# Patient Record
Sex: Male | Born: 1945
Health system: Southern US, Community
[De-identification: ages and names within clinical notes are randomized; demographics above are authoritative.]

## PROBLEM LIST (undated history)

## (undated) DIAGNOSIS — E785 Hyperlipidemia, unspecified: Secondary | ICD-10-CM

## (undated) DIAGNOSIS — I639 Cerebral infarction, unspecified: Secondary | ICD-10-CM

## (undated) DIAGNOSIS — I219 Acute myocardial infarction, unspecified: Secondary | ICD-10-CM

## (undated) DIAGNOSIS — I10 Essential (primary) hypertension: Secondary | ICD-10-CM

## (undated) DIAGNOSIS — C61 Malignant neoplasm of prostate: Secondary | ICD-10-CM

## (undated) DIAGNOSIS — H269 Unspecified cataract: Secondary | ICD-10-CM

## (undated) DIAGNOSIS — I5022 Chronic systolic (congestive) heart failure: Secondary | ICD-10-CM

## (undated) DIAGNOSIS — E119 Type 2 diabetes mellitus without complications: Secondary | ICD-10-CM

## (undated) DIAGNOSIS — Z77098 Contact with and (suspected) exposure to other hazardous, chiefly nonmedicinal, chemicals: Secondary | ICD-10-CM

## (undated) HISTORY — DX: Unspecified cataract: H26.9

## (undated) HISTORY — PX: PROSTATE BIOPSY: SHX241

## (undated) HISTORY — DX: Cerebral infarction, unspecified: I63.9

## (undated) HISTORY — DX: Type 2 diabetes mellitus without complications: E11.9

---

## 2003-01-19 ENCOUNTER — Encounter: Admission: RE | Admit: 2003-01-19 | Discharge: 2003-01-19 | Payer: Self-pay | Admitting: Cardiovascular Disease

## 2003-02-23 ENCOUNTER — Ambulatory Visit (HOSPITAL_BASED_OUTPATIENT_CLINIC_OR_DEPARTMENT_OTHER): Admission: RE | Admit: 2003-02-23 | Discharge: 2003-02-23 | Payer: Self-pay | Admitting: Otolaryngology

## 2003-02-23 ENCOUNTER — Ambulatory Visit (HOSPITAL_COMMUNITY): Admission: RE | Admit: 2003-02-23 | Discharge: 2003-02-23 | Payer: Self-pay | Admitting: Otolaryngology

## 2003-02-23 ENCOUNTER — Encounter (INDEPENDENT_AMBULATORY_CARE_PROVIDER_SITE_OTHER): Payer: Self-pay | Admitting: Specialist

## 2004-01-06 ENCOUNTER — Ambulatory Visit (HOSPITAL_COMMUNITY): Admission: RE | Admit: 2004-01-06 | Discharge: 2004-01-06 | Payer: Self-pay | Admitting: Specialist

## 2004-01-30 ENCOUNTER — Ambulatory Visit (HOSPITAL_COMMUNITY): Admission: RE | Admit: 2004-01-30 | Discharge: 2004-01-30 | Payer: Self-pay | Admitting: Cardiovascular Disease

## 2008-10-02 ENCOUNTER — Ambulatory Visit: Payer: Self-pay | Admitting: Internal Medicine

## 2008-10-02 ENCOUNTER — Encounter (INDEPENDENT_AMBULATORY_CARE_PROVIDER_SITE_OTHER): Payer: Self-pay | Admitting: Neurology

## 2008-10-02 ENCOUNTER — Inpatient Hospital Stay (HOSPITAL_COMMUNITY): Admission: EM | Admit: 2008-10-02 | Discharge: 2008-10-03 | Payer: Self-pay | Admitting: Emergency Medicine

## 2008-10-23 DIAGNOSIS — I2510 Atherosclerotic heart disease of native coronary artery without angina pectoris: Secondary | ICD-10-CM | POA: Insufficient documentation

## 2008-10-23 DIAGNOSIS — E119 Type 2 diabetes mellitus without complications: Secondary | ICD-10-CM

## 2008-10-23 DIAGNOSIS — I10 Essential (primary) hypertension: Secondary | ICD-10-CM | POA: Insufficient documentation

## 2009-05-03 ENCOUNTER — Encounter (INDEPENDENT_AMBULATORY_CARE_PROVIDER_SITE_OTHER): Payer: Self-pay | Admitting: *Deleted

## 2009-05-19 ENCOUNTER — Emergency Department (HOSPITAL_COMMUNITY): Admission: EM | Admit: 2009-05-19 | Discharge: 2009-05-19 | Payer: Self-pay | Admitting: Emergency Medicine

## 2010-04-25 NOTE — Letter (Signed)
Summary: Colonoscopy Date Change Letter  [facility]  [address] [place], [place] [address]   Phone: [phone]  Fax: [phone]      May 03, 2009 MRN: [id number]   Michael Roberts [address] [place], [place]  [address]   Dear [name],   Previously you were recommended to have a repeat colonoscopy around this time. Your chart was recently reviewed by Dr.Malcolm T. Russella Dar of [facility]. Follow up colonoscopy is now recommended in March 2014. This revised recommendation is based on current, nationally recognized guidelines for colorectal cancer screening and polyp surveillance. These guidelines are endorsed by the American Cancer Society, [employer] on Colorectal Cancer as well as numerous other major medical organizations.  Please understand that our recommendation assumes that you do not have any new symptoms such as bleeding, a change in bowel habits, anemia, or significant abdominal discomfort. If you do have any concerning GI symptoms or want to discuss the guideline recommendations, please call to arrange an office visit at your earliest convenience. Otherwise we will keep you in our reminder system and contact you 1-2 months prior to the date listed above to schedule your next colonoscopy.  Thank you,  Judie Petit T. Russella Dar, M.D.  [facility] [phone]

## 2010-06-30 LAB — COMPREHENSIVE METABOLIC PANEL
ALT: 24 U/L (ref 0–53)
AST: 31 U/L (ref 0–37)
Albumin: 3.7 g/dL (ref 3.5–5.2)
Alkaline Phosphatase: 54 U/L (ref 39–117)
BUN: 13 mg/dL (ref 6–23)
CO2: 25 mEq/L (ref 19–32)
Calcium: 9.1 mg/dL (ref 8.4–10.5)
Chloride: 104 mEq/L (ref 96–112)
Creatinine, Ser: 0.99 mg/dL (ref 0.4–1.5)
GFR calc Af Amer: >60 mL/min (ref >60)
GFR calc non Af Amer: >60 mL/min (ref >60)
Glucose, Bld: 155 mg/dL — ABNORMAL HIGH (ref 70–99)
Potassium: 4.6 mEq/L (ref 3.5–5.1)
Sodium: 136 mEq/L (ref 135–145)
Total Bilirubin: 0.8 mg/dL (ref 0.3–1.2)
Total Protein: 6.5 g/dL (ref 6.0–8.3)

## 2010-06-30 LAB — DIFFERENTIAL
Basophils Absolute: 0 10*3/uL (ref 0.0–0.1)
Basophils Relative: 1 % (ref 0–1)
Eosinophils Absolute: 0.1 10*3/uL (ref 0.0–0.7)
Eosinophils Relative: 3 % (ref 0–5)
Lymphocytes Relative: 38 % (ref 12–46)
Lymphs Abs: 1.4 10*3/uL (ref 0.7–4.0)
Monocytes Absolute: 0.3 10*3/uL (ref 0.1–1.0)
Monocytes Relative: 8 % (ref 3–12)
Neutro Abs: 1.9 10*3/uL (ref 1.7–7.7)
Neutrophils Relative %: 51 % (ref 43–77)

## 2010-06-30 LAB — TROPONIN I: Troponin I: 0.02 ng/mL (ref 0.00–0.06)

## 2010-06-30 LAB — URINALYSIS, ROUTINE W REFLEX MICROSCOPIC
Bilirubin Urine: NEGATIVE
Glucose, UA: NEGATIVE mg/dL
Hgb urine dipstick: NEGATIVE
Ketones, ur: NEGATIVE mg/dL
Nitrite: NEGATIVE
Protein, ur: NEGATIVE mg/dL
Specific Gravity, Urine: 1.014 (ref 1.005–1.030)
Urobilinogen, UA: 0.2 mg/dL (ref 0.0–1.0)
pH: 7 (ref 5.0–8.0)

## 2010-06-30 LAB — CK TOTAL AND CKMB (NOT AT ARMC)
CK, MB: 5.2 ng/mL — ABNORMAL HIGH (ref 0.3–4.0)
Relative Index: 2 (ref 0.0–2.5)
Total CK: 264 U/L — ABNORMAL HIGH (ref 7–232)

## 2010-06-30 LAB — PROTIME-INR
INR: 1 (ref 0.00–1.49)
Prothrombin Time: 13 seconds (ref 11.6–15.2)

## 2010-06-30 LAB — CBC
HCT: 44.6 % (ref 39.0–52.0)
Hemoglobin: 15.1 g/dL (ref 13.0–17.0)
MCHC: 33.8 g/dL (ref 30.0–36.0)
MCV: 92.3 fL (ref 78.0–100.0)
Platelets: 178 10*3/uL (ref 150–400)
RBC: 4.84 MIL/uL (ref 4.22–5.81)
RDW: 13 % (ref 11.5–15.5)
WBC: 3.7 10*3/uL — ABNORMAL LOW (ref 4.0–10.5)

## 2010-06-30 LAB — APTT: aPTT: 29 seconds (ref 24–37)

## 2010-06-30 LAB — HEMOGLOBIN A1C
Hgb A1c MFr Bld: 7 % — ABNORMAL HIGH (ref 4.6–6.1)
Mean Plasma Glucose: 154 mg/dL

## 2010-08-06 NOTE — H&P (Signed)
NAME:  Michael Roberts, Michael Roberts               ACCOUNT NO.:  [id number]   MEDICAL RECORD NO.:  [id number]          PATIENT TYPE:  INP   LOCATION:  3106                         FACILITY:  [facility]   PHYSICIAN:  Pramod P. Pearlean Brownie, MD    DATE OF BIRTH:  [birth year]   DATE OF ADMISSION:  10/02/2008  DATE OF DISCHARGE:                              HISTORY & PHYSICAL   REFERRING PHYSICIAN:  Jerelyn Scott, MD   REASON FOR REFERRAL:  Stroke.   HISTORY OF PRESENT ILLNESS:  Mr. Smolen is a 65 year old African  American gentleman who states he went to sleep at 11:00 p.m. last night  and was feeling fine.  He woke up at 12 o'clock to go to the restroom  and was able to walk without problems.  When he woke up again at 2:00  a.m., he noticed that he had left-sided weakness and incoordination and  some slurred speech.  His symptoms have gradually improved since then.  He presented to [facility] Emergency Room beyond the time window for t-  PA.  He feels his slurred speech and weakness and numbness has improved,  though he cannot comment on his balance as he has not walked.  He has no  known prior history of TIA, seizures, strokes, or significant  neurological problems.   Past medical history is significant for hypertension.   HOME MEDICATIONS:  Carvedilol, Lasix, lisinopril, Viagra.   SOCIAL HISTORY:  The patient is married, lives with his wife.  He smokes  cigars occasionally and does not drink alcohol.  He is independent in  activities of daily living.  [facility] the [facility].   REVIEW OF SYSTEMS:  Negative for any recent fever, cough, chest pain,  diarrhea, or other illness.  No active bleeding.   PHYSICAL EXAMINATION:  GENERAL:  A middle-aged African American  gentleman, currently not in distress.  VITAL SIGNS:  He is afebrile with temperature 97.7, blood pressure  166/96, pulse rate 64 per minute, respiratory rate 14 per minute, oxygen  saturation 98% on room  air.  HEAD:  Nontraumatic.  NECK:  Supple.  There is no bruit.  ENT:  Unremarkable.  CARDIAC:  Regular heart sounds.  No murmur or gallop.  LUNGS:  Clear to auscultation.  ABDOMEN:  Soft, nontender.  NEUROLOGIC:  The patient is pleasant, awake, alert, cooperative.  There  is no aphasia, apraxia, dysarthria.  He is oriented to time, place, and  person.  His eye movements are full range.  Visual fields are full to  confrontation testing.  Face is symmetric.  Tongue is midline.  Motor  system exam reveals no upper or lower extremity drift; however, he has  mild finger-to-nose and knee-to-heel dysmetria on the left.  Fine finger  movements that diminish on the left.  He orbits the right over left  upper extremity.  He has good grip strength bilaterally.  Touch and  pinprick sensation are preserved.  His gait was not tested.  Plantars  are downgoing.   On NIH stroke scale, he scored 2.  Modified Rankin scale 0.  DATA REVIEWED:  Noncontrast CAT scan of the head shows a very ill-  defined hypodensity in the right internal capsule and corona radiata  which may represent acute infarct.  Admission labs are pending at this  time.   IMPRESSION:  A 65 year old gentleman with left-sided weakness, numbness,  and incoordination with slurred speech, likely due to the right brain  subcortical infarct.  The patient has presented beyond time window for  thrombolysis but within 12 hours.   PLAN:  I had a long discussion with the patient and his wife regarding  available treatment options.  The patient may meet criteria for  participation in the Point stroke trial.  The patient had expressed some  interest.  We will give him information to review and he will make the  decision on his own.  He will be admitted for further stroke workup.  We  will check MRI scan of the brain, MRA of the brain, carotid transcranial  Doppler studies, 2-D echo, fasting lipid profile, hemoglobin A1c.  He  will have a  swallow eval, and if he passes will be started on aspirin  for now.  He will have physical, occupation, speech therapy consults.           ______________________________  Sunny Schlein. Pearlean Brownie, MD     PPS/MEDQ  D:  10/02/2008  T:  10/03/2008  Job:  [id number]

## 2010-08-06 NOTE — Consult Note (Signed)
NAME:  Michael Roberts, Michael Roberts NO.:  [id number]   MEDICAL RECORD NO.:  [id number]          PATIENT TYPE:  INP   LOCATION:  3038                         FACILITY:  [facility]   PHYSICIAN:  Michael Salvia, MD, FACCDATE OF BIRTH:  [birth year]   DATE OF CONSULTATION:  10/03/2008  DATE OF DISCHARGE:  10/03/2008                                 CONSULTATION   PRIMARY CARE PHYSICIAN:  Michael Headings. Marina Goodell, MD   PRIMARY CARDIOLOGIST:  New, follow up in [place].   CHIEF COMPLAINT:  Left ventricular dysfunction.   HISTORY OF PRESENT ILLNESS:  Michael Roberts is a 65 year old male with no  previous history of coronary artery disease.  He was previously a  patient of Michael Roberts and his EF was noted to be 30%.  Cardiac  catheterization was recommended, but the patient never had the  procedure.   On October 02, 2008, Michael Roberts noted left-sided weakness, incoordination  and some slurred speech.  He came to the emergency room, but with beyond  the window for tPA.  Based on his CT and then MRI, MRA, he was diagnosed  with an acute CVA and admitted.  His symptoms have significantly  improved, but not completely resolved.  He has no prior history of TIA,  seizures, or strokes.  As part of his evaluation, a 2-D echocardiogram  was performed, which showed left ventricular dysfunction with an EF of  25-30%.  Cardiology was asked to evaluate him.   Michael Roberts has never had chest pain.  He also denies shortness of  breath, dyspnea on exertion, orthopnea, PND, edema, or other ischemic  symptoms.  He also denies palpitations.  He admits to a history of  noncompliance with his hypertensive medications, but was states the  medication regimen he was on prior to admission did not have the side  effects he had had in the past and he tolerates these medicines well.  He does not check his blood pressure at home.  He also admits to alcohol  overindulgence and states that he used to drink more heavily than he  does now.   PAST MEDICAL HISTORY:  1. Hypertension.  2. Borderline diabetes.  3. Hyperlipidemia.  4. Obesity with body mass index of 32 in 2009.  5. Status post echocardiogram in March 2009 showing an EF of 30%,      dilated LV with mild LVH and moderate generalized hypokinesia, are      being normal, mild-to-moderate MR and mild TR with minimal AI and      PI.   PAST SURGICAL HISTORY:  He is status post nasal surgery and knee  surgery.   ALLERGIES:  No known drug allergies.   MEDICATIONS PRIOR TO ADMISSION:  1. Coreg 6.25 mg one half tablet b.i.d.  2. Lisinopril 40 mg daily.  3. Lasix 40 mg a day.  4. Viagra 20 mg one half tablet p.r.n.   SOCIAL HISTORY:  He lives in [place] with his wife and is a former  Technical sales engineer, currently working as a Equities trader.  He smokes an  occasional cigar.  He admits  to drinking alcohol on a regular basis and  sometimes overindulging.  He states he used to drink more in the past,  but has cut back.  He denies drug abuse.   FAMILY HISTORY:  His mother died at age 72 of uterine cancer and his  father died at age 23 in a fire.  Neither one with heart disease, but he  does have one sister who died at age 107 of an MI.   REVIEW OF SYSTEMS:  He has weakness and discoordination on his left  side.  He has arthralgias because of the knee surgeries.  He denies  hematemesis, hemoptysis, or melena.  A full 14-point review of systems  is negative other than as stated in the HPI.   PHYSICAL EXAMINATION:  VITAL SIGNS:  Temperature is 98.3, blood pressure  151/83, pulse 64, respiratory rate 20, O2 saturation 98% on room air.  GENERAL:  He is a well-developed [place] American male in no acute  distress.  HEENT:  Normal.  NECK:  There is no lymphadenopathy, thyromegaly, bruit, or JVD noted.  CV:  His heart is regular in rate and rhythm with an S1 and S2, the S2  is pronounced, but no S3 murmur or rub is noted.  Distal pulses are  intact in all four  extremities with no femoral bruits appreciated.  LUNGS:  Essentially clear to auscultation bilaterally.  SKIN:  No rashes or lesions are noted.  ABDOMEN:  Soft and nontender with active bowel sounds and no  splenomegaly is noted.  EXTREMITIES:  There is no cyanosis, clubbing, or edema noted.  MUSCULOSKELETAL:  There is no joint deformity or effusion, although his  right knee has a large midline scar.  There is no spine or CVA  tenderness.  NEUROLOGIC:  He is alert and oriented.  Cranial nerves II through XII  grossly intact and has some left-sided weakness.   CT of the head shows right possibly acute versus subacute infarct in the  right thalamic area, MRI shows an acute infarct and MRA is negative.   EKG is sinus rhythm, rate 69 beats per minute with left bundle-branch  block and no acute ischemic changes.   LABORATORY VALUES:  Hemoglobin 15.1, hematocrit 44.6, WBCs 3.7,  platelets 178.  Sodium 136, potassium 4.6, chloride 104, CO2 25, BUN 13,  creatinine 0.99, glucose 155.  Hemoglobin A1c 7.0, CK-MB 264/5.2 with an  index of 2.0 and troponin I of 0.02.   Echocardiogram shows an EF of 25-30% with mild AR/MR and mild concentric  LVH.   Michael Roberts was seen today by Michael Roberts.   IMPRESSION:  1. Proven cerebrovascular accident felt secondary to small vessel      disease.  2. Cardiomyopathy for greater than 1 year with an ejection fraction of      approximately 30% by echo.  3. Chronic systolic congestive heart failure, class 1.  4. History of EtOH overuse, questionably causing cardiomyopathy.  5. Hypertension, poorly controlled in the past.   RECOMMENDATIONS:  1. ACE inhibitor as prior to admission.  2. Beta-blocker as prior to admission with uptitration as blood      pressure and heart rate will tolerate.  3. Anticoagulation per protocol with the patient having been enrolled      in the POINT trial study, per Neurology.  4. Follow up in the office with cardiac  catheterization to be      considered and possibly scheduled as an outpatient.  5. EtOH abstention.  6. Consideration for ICD if his EF does not improve in 3-6 months and      he is abstaining from alcohol and tobacco.  7. Cardiology followup has been arranged with Dr. Olga Millers of      [facility] on October 24, 2008 at 2:30 p.m.  If he wishes to      obtain Cardiology followup at the [facility] that would also be      appropriate.      Theodore Demark, PA-C      Michael Salvia, MD, [facility]  Electronically Signed    RB/MEDQ  D:  10/03/2008  T:  10/04/2008  Job:  [id number]

## 2010-08-06 NOTE — Discharge Summary (Signed)
Michael Roberts, Michael Roberts               ACCOUNT NO.:  [id number]   MEDICAL RECORD NO.:  [id number]          PATIENT TYPE:  INP   LOCATION:  3038                         FACILITY:  [facility]   PHYSICIAN:  Pramod P. Pearlean Brownie, MD    DATE OF BIRTH:  1945-07-17   DATE OF ADMISSION:  10/02/2008  DATE OF DISCHARGE:  10/03/2008                               DISCHARGE SUMMARY   DISCHARGE DIAGNOSES:  1. Right thalamic infarct secondary to small vessel disease, enrolled      in POINT trial.  2. Cigarette smoker.  3. Left ventricular dysfunction with history of low ejection fraction      in March 2009 with workup recommended, but no followup.  4. Hypertension.  5. Borderline diabetes.  6. Tobacco abuse.  7. Nasal surgery.  8. Bilateral knee surgery with tendon repair x3.   DISCHARGE MEDICINES:  1. Coreg 6.25 mg 1/2 b.i.d.  2. Lisinopril 40 mg a day.  3. Furosemide 40 mg a day.  4. Lisinopril and hydrochlorothiazide 10 mg a day.  5. Zocor 20 mg a day.  6. Aspirin 325 mg a day.  7. End-point study drug (Plavix versus placebo).  8. Viagra p.r.n.   STUDIES PERFORMED:  1. CT of the brain done on admission shows low-density right thalamus      extending to the posterior limb internal capsule, subacute versus      acute infarct.  No hemorrhage.  Old-appearing small vessel changes      elsewhere within the basal ganglion hemispheric white matter.  2. MRI of the brain shows acute infarct of right thalamus with      extension towards the posterior limb internal capsule.  3. MRA of the brain is negative.  4. 2-D echocardiogram shows EF of 25-30% with no obvious source of      embolus.  5. Carotid Doppler shows no ICA stenosis.  6. EKG shows sinus rhythm.  7. Transcranial Doppler performed, results pending.   LABORATORY STUDIES:  Hemoglobin A1c 7.0.  Troponin-I 0.02.  CK 264, CK-  MB 5.2.  Urinalysis negative.  Coagulation studies normal.  Chemistry  with glucose 155, otherwise normal.  Liver function  tests normal.  CBC  with white blood cells 3.7, otherwise normal.  Lipid profile ordered,  not performed.   HISTORY OF PRESENT ILLNESS:  Mr. Beckstrand is a 65 year old right-handed  African American male who went to sleep at 11 p.m. the night prior to  admission feeling normal.  He woke up at 12 midnight to go to the  restroom and was able to walk without problems.  When he woke up again  at 2 a.m., he noticed that he had left-sided weakness and incoordination  with slurred speech.  His symptoms have gradually improved.  He  presented to [facility] Room beyond the time window for tPA.  He feels the slurred speech and weakness and numbness has improved,  though he cannot comment on his balance as he is 65 year old right-handed  African American male who went to sleep at 11 p.m. the night prior to  admission feeling normal.  He woke up at 12 midnight to go to the  restroom and was able to walk without problems.  When he woke up again  at 2 a.m., he noticed that he had left-sided weakness and incoordination  with slurred speech.  His symptoms have gradually improved.  He  presented to [facility] Emergency Room beyond the time window for tPA.  He feels the slurred speech and weakness and numbness has improved,  though he cannot comment on his balance as he is not old.  He has had no  history of TIA, seizure, stroke, or significant neurologic problems.  He  does have  a history significant for hypertension.  He was admitted to  the hospital for further stroke evaluation.    He has had no  history of TIA, seizure, stroke, or significant neurologic problems.  He  does have  a history significant for hypertension.  He was admitted to  the hospital for further stroke evaluation.   HOSPITAL COURSE:  MRI was positive for acute infarct in the right  thalamic region that would explain his left-sided numbness and  incoordination.  Due to the mildness of his symptoms, he was enrolled in  POINT trial which is a trial of aspirin and Plavix versus aspirin and  placebo.  He was started on study drug in the hospital and we will  continue it for 3 months.  It was felt that his further stroke was  secondary to small vessel disease and the patient was placed on aspirin  for secondary stroke prevention.  Of note, his 2-D echocardiogram  revealed EF of 25-30%.  After cardiology consult, it was determined that  this ischemia was present in March 2009 when he saw Dr. Algie Coffer.  At  that time, he had EF of 30%.  In March 2009, the patient was recommended  to have workup but did not follow up.  Recommendations from  Cardiologist, he is to do a cardiac workup in the future to evaluate  etiology of dysfunction.  It is not deemed to be done currently as an  inpatient  and he is safe for discharge home.  Of note, [facility] [place] and can follow up with either  Cardiology there or with [place].  He was evaluated by PT and OT and  felt to have some minimal clumsiness and outpatient therapy followup was  recommended.   CONDITION ON DISCHARGE:  The patient is alert and oriented x3.  No  aphasia.  No dysarthria.  Eye movements are full.  Face is symmetric.  Tongue is midline.  He has no drift, no focal weakness.  He has mild  left finger-nose dysmetria and heel-to-shin ataxia.  His gait is okay  per Therapy.   DISCHARGE/PLAN:  1. Discharge home with family.  2. Add aspirin end-point study drug for secondary stroke prevention.   FOLLOWUP:  1. Follow up with Cardiology either via [place], [place] or [place] for LV      dysfunction workup.  2. Follow up Dr. Pearlean Brownie in his office in 2-3 months.  3. Outpatient PT/OT.      Annie Main, N.P.    ______________________________  Sunny Schlein. Pearlean Brownie, MD    SB/MEDQ  D:  10/03/2008  T:  10/04/2008  Job:  [id number]   cc:   [facility]  [facility]

## 2010-08-09 NOTE — Op Note (Signed)
NAMEWEBBER, MICHIELS                           ACCOUNT NO.:  [id number]   MEDICAL RECORD NO.:  [id number]                   PATIENT TYPE:  AMB   LOCATION:  DSC                                  FACILITY:  [facility]   PHYSICIAN:  Hermelinda Medicus, M.D.                DATE OF BIRTH:  1945/04/17   DATE OF PROCEDURE:  02/23/2003  DATE OF DISCHARGE:                                 OPERATIVE REPORT   PREOPERATIVE DIAGNOSIS:  Septal deviation, turbinate hypertrophy with nasal  obstruction with left maxillary sinusitis with left maxillary mucocyst with  dental involvement.   POSTOPERATIVE DIAGNOSIS:  Septal deviation, turbinate hypertrophy with nasal  obstruction with left maxillary sinusitis with left maxillary mucocyst with  dental involvement.   OPERATION:  Functional endoscopic sinus surgery left maxillary sinus osteal  enlargement with antrostomy with removal of mucocyst with culture, aerobic  and anaerobic, septal reconstruction, turbinate reduction.   SURGEON:  Hermelinda Medicus, M.D.   ANESTHESIA:  Local anesthesia with MAC support with 1% Xylocaine with  epinephrine, 8 mL, with topical cocaine 200 mg.   PROCEDURE:  The patient was placed in the supine position.  Under local  anesthesia, was prepped and draped in the appropriate manner using the usual  HCG, the usual head drape was used.  Once the local anesthesia was  instilled, a hemitransfixion incision was made on the right side, carried  around the columella to the left side, back along the quadrilateral  cartilage which was grossly off the premaxillary crest.  We took a strip of  cartilage from the premaxillary area and also an area of the bony premaxilla  was trimmed using the 4 mm chisel.  We then worked more posteriorly where we  took a thin strip of the quadrilateral cartilage posteriorly and then used  open and closed Jansen-Middletons to remove a very deviated septal ethmoid  region and vomer region septal deviation.  Once  this was removed, the  remainder of the septum was brought back to the midline and we could then  see more reasonably within his nose.  The inferior turbinates were  aggressively out fractured and no mucous membrane was removed and the middle  turbinates were found to be in quite good condition.  The closure was begun  after hemostasis was established with Bovie electrocoagulation and there was  one bleeder which we needed to touch and was done without difficulty.  Closure was with 5-0 plain catgut and through and through septal suture  using blanket stitch x 2 to minimize septal swelling.   Attention was turned to the left maxillary sinus where we could not see the  natural ostium as it was buried in mucous membrane.  We used a curved  suction to touch the area we felt it was near.  We were able to find this by  palpation and then using the scope and the side biting forceps, we  increased  this opening to approximately five times its normal size.  We also used the  angled Blakesley to remove the thick mucous membrane around this and to  increase its size, also.  Once we were in the sinus, we could see the  grayish membrane but the problem was in the floor of the sinus.  We used an  antrostomy approach and then the giraffe forceps to remove thick mucoid type  what appeared to be infected mucocyst material and cultured this aerobic and  anaerobic.  Once this was completed, the sinus was suctioned and the cyst  was removed.  We then placed Merocel packs on both sides which will be  removed tomorrow.  The patient tolerated the procedure well and is doing  well postop and will be continued on his antibiotic  as he did have a murmur.  He was given Ancef IV, Decadron 8, will continue  with Ceftin, and will give him Tylenol with codeine.  His follow up further  beyond tomorrow will be in one week, three weeks, six weeks, and then six  months.                                               Hermelinda Medicus, M.D.    JC/MEDQ  D:  02/23/2003  T:  02/23/2003  Job:  [id number]   cc:   Ricki Rodriguez, M.D.  [address]  [place] [address]

## 2010-08-09 NOTE — H&P (Signed)
Michael Roberts, Michael Roberts                           ACCOUNT NO.:  [id number]   MEDICAL RECORD NO.:  [id number]                   PATIENT TYPE:  AMB   LOCATION:  [facility]                                  FACILITY:  [facility]   PHYSICIAN:  Hermelinda Medicus, M.D.                DATE OF BIRTH:  January 15, 1946   DATE OF ADMISSION:  02/23/2003  DATE OF DISCHARGE:                                HISTORY & PHYSICAL   This patient is a 65 year old male who has had a persistent left maxillary  sinusitis problem with nasal obstruction.  He has also had dental problems  and has had this evaluated but has considerable pain in his left cheek.  He  has been treated by Dr. Algie Coffer with antibiotics using primarily amoxicillin  and Augmentin and has had some temporary improvement but continues to have  some facial swelling with tenderness, congestion on that left side.  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  He now enters for functional endoscopic sinus  surgery for a left maxillary ostial enlargement, antrostomy with removal of  this mucopyocyst for culture.  Also for a septal reconstruction, turbinate  reduction.   Past history is that of medications:  1. Furosemide 40 mg daily.  2. Atenolol 50 daily.  3. Clonidine 0.2 mg daily.  4. Diltiazem 180 mg daily.  5. Micardis HCT 80 mg daily.  6. Tequin 40 mg daily as his antibiotic at this time.   He has no allergies to medications.  He has had knee surgery in the past.  He does have a murmur, grade 2/6 systolic, without hypertension.  He has had  one loose tooth, top front.  The remainder of his history is that of a  borderline diabetic system which he can control with his diet.   PHYSICAL EXAMINATION:  GENERAL:  Well-nourished, well-developed male in no  acute distress.  VITAL SIGNS:  Blood pressure of 120/76.  He is 6 feet  2 inches.  He weighs  243.  HEENT:  His ears are clear.  Oral cavity is clear.  His nose is showing a  septal deviation with nasal obstruction.  He is having some drainage from  his sinus that has been purulent but is now clear on the Tequin.  Nasopharynx, oropharynx, larynx are clear.  NECK:  Free of any thyromegaly, cervical adenopathy, or mass.  Salivary  gland is unremarkable.  CHEST:  Clear.  No rales, rhonchi, or wheezes.  CARDIOVASCULAR:  A grade 2/6 holosystolic murmur.  ABDOMEN:  Unremarkable.  EXTREMITIES:  Unremarkable except for his knee surgery.   INITIAL DIAGNOSES:  1. Septal deviation.  2. Left maxillary sinusitis.  3. History of hypertension.  4. History of holosystolic murmur, grade 2/6.  5.  History of knee surgery.                                                Hermelinda Medicus, M.D.    JC/MEDQ  D:  02/23/2003  T:  02/23/2003  Job:  [id number]   cc:   Ricki Rodriguez, M.D.  [address]  [place] [address]

## 2012-03-09 ENCOUNTER — Ambulatory Visit (INDEPENDENT_AMBULATORY_CARE_PROVIDER_SITE_OTHER): Payer: Medicare Other | Admitting: Radiology

## 2012-03-09 DIAGNOSIS — Z23 Encounter for immunization: Secondary | ICD-10-CM

## 2012-05-25 ENCOUNTER — Encounter: Payer: Self-pay | Admitting: Gastroenterology

## 2012-05-26 ENCOUNTER — Encounter: Payer: Self-pay | Admitting: Gastroenterology

## 2013-02-01 ENCOUNTER — Ambulatory Visit (INDEPENDENT_AMBULATORY_CARE_PROVIDER_SITE_OTHER): Payer: Medicare Other

## 2013-02-01 DIAGNOSIS — Z23 Encounter for immunization: Secondary | ICD-10-CM

## 2013-03-24 DIAGNOSIS — I639 Cerebral infarction, unspecified: Secondary | ICD-10-CM

## 2013-03-24 DIAGNOSIS — I219 Acute myocardial infarction, unspecified: Secondary | ICD-10-CM

## 2013-03-24 HISTORY — DX: Cerebral infarction, unspecified: I63.9

## 2013-03-24 HISTORY — DX: Acute myocardial infarction, unspecified: I21.9

## 2013-10-18 ENCOUNTER — Encounter: Payer: Self-pay | Admitting: Gastroenterology

## 2015-01-17 ENCOUNTER — Ambulatory Visit (INDEPENDENT_AMBULATORY_CARE_PROVIDER_SITE_OTHER): Payer: Commercial Managed Care - HMO | Admitting: Physician Assistant

## 2015-01-17 ENCOUNTER — Ambulatory Visit (INDEPENDENT_AMBULATORY_CARE_PROVIDER_SITE_OTHER): Payer: Commercial Managed Care - HMO

## 2015-01-17 VITALS — BP 150/90 | HR 84 | Temp 98.0°F | Resp 20 | Ht 73.0 in | Wt 235.8 lb

## 2015-01-17 DIAGNOSIS — M129 Arthropathy, unspecified: Secondary | ICD-10-CM

## 2015-01-17 DIAGNOSIS — Z23 Encounter for immunization: Secondary | ICD-10-CM

## 2015-01-17 DIAGNOSIS — M25511 Pain in right shoulder: Secondary | ICD-10-CM | POA: Diagnosis not present

## 2015-01-17 DIAGNOSIS — M19011 Primary osteoarthritis, right shoulder: Secondary | ICD-10-CM

## 2015-01-17 MED ORDER — DICLOFENAC SODIUM 1 % TD GEL: 2.0000 g | Freq: Four times a day (QID) | TRANSDERMAL | Status: DC

## 2015-01-17 MED ORDER — TRIAMCINOLONE ACETONIDE 40 MG/ML IJ SUSP: 40.0000 mg | Freq: Once | INTRAMUSCULAR | Status: DC

## 2015-01-17 NOTE — Progress Notes (Signed)
   Subjective:    Patient ID: Michael Roberts, male    DOB: 1946/01/09, 69 y.o.   MRN: [id number]  HPI Patient presents for right shoulder pain that has been present for the past 4 days and has gotten worse. Started as a dull ache, but is now constant and nagging. Pain does not radiate. Denies trauma, but adds he has arthritis in both knees that requires cortisone injections and pain feels similar to when knees weren't treated. Taking bioflex for knees and has not helped shoulder pain. Says that has had numbness in right hand a couple of times, but not sure if associated with anything. Denies weakness, tingling, loss sensation/ROM/fxn. NKDA.   Review of Systems As noted above.    Objective:   Physical Exam  Constitutional: He is oriented to person, place, and time. He appears well-developed and well-nourished. No distress.  Blood pressure 150/90, pulse 84, temperature 98 F (36.7 C), temperature source Oral, resp. rate 20, height 6\' 1"  (1.854 m), weight 235 lb 12.8 oz (106.958 kg), SpO2 98 %.  HENT:  Head: Normocephalic and atraumatic.  Right Ear: External ear normal.  Left Ear: External ear normal.  Eyes: Conjunctivae and EOM are normal. Pupils are equal, round, and reactive to light. Right eye exhibits no discharge. Left eye exhibits no discharge. No scleral icterus.  Neck: Normal range of motion. Neck supple.  Pulmonary/Chest: Effort normal.  Musculoskeletal: Normal range of motion. He exhibits no edema or tenderness.       Right shoulder: He exhibits crepitus and pain. He exhibits normal range of motion, no tenderness, no bony tenderness, no swelling, no effusion, no deformity, no laceration, no spasm, normal pulse and normal strength.       Left shoulder: Normal.       Cervical back: Normal.       Thoracic back: Normal.       Lumbar back: Normal.       Right upper arm: Normal.       Right hand: Normal.  Lymphadenopathy:    He has no cervical adenopathy.  Neurological: He is  alert and oriented to person, place, and time. He has normal strength and normal reflexes. He displays no atrophy. No cranial nerve deficit or sensory deficit. He exhibits normal muscle tone. Coordination normal.  Skin: Skin is warm and dry. No rash noted. He is not diaphoretic. No erythema.  Psychiatric: He has a normal mood and affect. His behavior is normal. Judgment and thought content normal.   [facility] reading (PRIMARY) by  Dr. Brigitte Pulse. Mild degenerative changes. No acute bony abnormalities.     Assessment & Plan:  1. Shoulder pain, acute, right 2. Arthritis of shoulder region, right Shoulder exercises given. - DG Shoulder Right; Future - triamcinolone acetonide (KENALOG-40) injection 40 mg; Inject 1 mL (40 mg total) into the muscle once. - diclofenac sodium 1% gel 2g topically QID daily if no improvement from injection.  3. Need for prophylactic vaccination and inoculation against influenza - Flu Vaccine QUAD 36+ mos IM   Coralie Stanke PA-C  [facility] 01/17/2015 4:14 PM

## 2015-01-17 NOTE — Patient Instructions (Signed)
Generic Shoulder Exercises  EXERCISES   RANGE OF MOTION (ROM) AND STRETCHING EXERCISES  These exercises may help you when beginning to rehabilitate your injury. Your symptoms may resolve with or without further involvement from your physician, physical therapist or athletic trainer. While completing these exercises, remember:   · Restoring tissue flexibility helps normal motion to return to the joints. This allows healthier, less painful movement and activity.  · An effective stretch should be held for at least 30 seconds.  · A stretch should never be painful. You should only feel a gentle lengthening or release in the stretched tissue.  ROM - Pendulum  · Bend at the waist so that your right / left arm falls away from your body. Support yourself with your opposite hand on a solid surface, such as a table or a countertop.  · Your right / left arm should be perpendicular to the ground. If it is not perpendicular, you need to lean over farther. Relax the muscles in your right / left arm and shoulder as much as possible.  · Gently sway your hips and trunk so they move your right / left arm without any use of your right / left shoulder muscles.  · Progress your movements so that your right / left arm moves side to side, then forward and backward, and finally, both clockwise and counterclockwise.  · Complete __________ repetitions in each direction. Many people use this exercise to relieve discomfort in their shoulder as well as to gain range of motion.  Repeat __________ times. Complete this exercise __________ times per day.  STRETCH - Flexion, Standing  · Stand with good posture. With an underhand grip on your right / left hand and an overhand grip on the opposite hand, grasp a broomstick or cane so that your hands are a little more than shoulder-width apart.  · Keeping your right / left elbow straight and shoulder muscles relaxed, push the stick with your opposite hand to raise your right / left arm in front of your  body and then overhead. Raise your arm until you feel a stretch in your right / left shoulder, but before you have increased shoulder pain.  · Try to avoid shrugging your right / left shoulder as your arm rises by keeping your shoulder blade tucked down and toward your mid-back spine. Hold __________ seconds.  · Slowly return to the starting position.  Repeat __________ times. Complete this exercise __________ times per day.  STRETCH - Internal Rotation  · Place your right / left hand behind your back, palm-up.  · Throw a towel or belt over your opposite shoulder. Grasp the towel/belt with your right / left hand.  · While keeping an upright posture, gently pull up on the towel/belt until you feel a stretch in the front of your right / left shoulder.  · Avoid shrugging your right / left shoulder as your arm rises by keeping your shoulder blade tucked down and toward your mid-back spine.  · Hold __________. Release the stretch by lowering your opposite hand.  Repeat __________ times. Complete this exercise __________ times per day.  STRETCH - External Rotation and Abduction  · Stagger your stance through a doorframe. It does not matter which foot is forward.  · As instructed by your physician, physical therapist or athletic trainer, place your hands:    And forearms above your head and on the door frame.    And forearms at head-height and on the door frame.      At elbow-height and on the door frame.  · Keeping your head and chest upright and your stomach muscles tight to prevent over-extending your low-back, slowly shift your weight onto your front foot until you feel a stretch across your chest and/or in the front of your shoulders.  · Hold __________ seconds. Shift your weight to your back foot to release the stretch.  Repeat __________ times. Complete this stretch __________ times per day.   STRENGTHENING EXERCISES   These exercises may help you when beginning to rehabilitate your injury. They may resolve your  symptoms with or without further involvement from your physician, physical therapist or athletic trainer. While completing these exercises, remember:   · Muscles can gain both the endurance and the strength needed for everyday activities through controlled exercises.  · Complete these exercises as instructed by your physician, physical therapist or athletic trainer. Progress the resistance and repetitions only as guided.  · You may experience muscle soreness or fatigue, but the pain or discomfort you are trying to eliminate should never worsen during these exercises. If this pain does worsen, stop and make certain you are following the directions exactly. If the pain is still present after adjustments, discontinue the exercise until you can discuss the trouble with your clinician.  · If advised by your physician, during your recovery, avoid activity or exercises which involve actions that place your right / left hand or elbow above your head or behind your back or head. These positions stress the tissues which are trying to heal.  STRENGTH - Scapular Depression and Adduction  · With good posture, sit on a firm chair. Supported your arms in front of you with pillows, arm rests or a table top. Have your elbows in line with the sides of your body.  · Gently draw your shoulder blades down and toward your mid-back spine. Gradually increase the tension without tensing the muscles along the top of your shoulders and the back of your neck.  · Hold for __________ seconds. Slowly release the tension and relax your muscles completely before completing the next repetition.  · After you have practiced this exercise, remove the arm support and complete it in standing as well as sitting.  Repeat __________ times. Complete this exercise __________ times per day.   STRENGTH - External Rotators  · Secure a rubber exercise band/tubing to a fixed object so that it is at the same height as your right / left elbow when you are standing  or sitting on a firm surface.  · Stand or sit so that the secured exercise band/tubing is at your side that is not injured.  · Bend your elbow 90 degrees. Place a folded towel or small pillow under your right / left arm so that your elbow is a few inches away from your side.  · Keeping the tension on the exercise band/tubing, pull it away from your body, as if pivoting on your elbow. Be sure to keep your body steady so that the movement is only coming from your shoulder rotating.  · Hold __________ seconds. Release the tension in a controlled manner as you return to the starting position.  Repeat __________ times. Complete this exercise __________ times per day.   STRENGTH - Supraspinatus  · Stand or sit with good posture. Grasp a __________ weight or an exercise band/tubing so that your hand is "thumbs-up," like when you shake hands.  · Slowly lift your right / left hand from your thigh into the air,   traveling about 30 degrees from straight out at your side. Lift your hand to shoulder height or as far as you can without increasing any shoulder pain. Initially, many people do not lift their hands above shoulder height.  · Avoid shrugging your right / left shoulder as your arm rises by keeping your shoulder blade tucked down and toward your mid-back spine.  · Hold for __________ seconds. Control the descent of your hand as you slowly return to your starting position.  Repeat __________ times. Complete this exercise __________ times per day.   STRENGTH - Shoulder Extensors  · Secure a rubber exercise band/tubing so that it is at the height of your shoulders when you are either standing or sitting on a firm arm-less chair.  · With a thumbs-up grip, grasp an end of the band/tubing in each hand. Straighten your elbows and lift your hands straight in front of you at shoulder height. Step back away from the secured end of band/tubing until it becomes tense.  · Squeezing your shoulder blades together, pull your hands down  to the sides of your thighs. Do not allow your hands to go behind you.  · Hold for __________ seconds. Slowly ease the tension on the band/tubing as you reverse the directions and return to the starting position.  Repeat __________ times. Complete this exercise __________ times per day.   STRENGTH - Scapular Retractors  · Secure a rubber exercise band/tubing so that it is at the height of your shoulders when you are either standing or sitting on a firm arm-less chair.  · With a palm-down grip, grasp an end of the band/tubing in each hand. Straighten your elbows and lift your hands straight in front of you at shoulder height. Step back away from the secured end of band/tubing until it becomes tense.  · Squeezing your shoulder blades together, draw your elbows back as you bend them. Keep your upper arm lifted away from your body throughout the exercise.  · Hold __________ seconds. Slowly ease the tension on the band/tubing as you reverse the directions and return to the starting position.  Repeat __________ times. Complete this exercise __________ times per day.  STRENGTH - Scapular Depressors  · Find a sturdy chair without wheels, such as a from a dining room table.  · Keeping your feet on the floor, lift your bottom from the seat and lock your elbows.  · Keeping your elbows straight, allow gravity to pull your body weight down. Your shoulders will rise toward your ears.  · Raise your body against gravity by drawing your shoulder blades down your back, shortening the distance between your shoulders and ears. Although your feet should always maintain contact with the floor, your feet should progressively support less body weight as you get stronger.  · Hold __________ seconds. In a controlled and slow manner, lower your body weight to begin the next repetition.  Repeat __________ times. Complete this exercise __________ times per day.      This information is not intended to replace advice given to you by your health  care provider. Make sure you discuss any questions you have with your health care provider.     Document Released: 01/22/2005 Document Revised: 03/31/2014 Document Reviewed: 06/22/2008  [employer]vier Interactive Patient Education ©2016 [employer]

## 2015-01-22 ENCOUNTER — Ambulatory Visit: Payer: Self-pay | Admitting: Family Medicine

## 2015-06-19 ENCOUNTER — Inpatient Hospital Stay (HOSPITAL_COMMUNITY): Payer: Medicare Other

## 2015-06-19 ENCOUNTER — Encounter (HOSPITAL_COMMUNITY): Payer: Self-pay | Admitting: *Deleted

## 2015-06-19 ENCOUNTER — Inpatient Hospital Stay (HOSPITAL_COMMUNITY)
Admission: EM | Admit: 2015-06-19 | Discharge: 2015-06-25 | DRG: 286 | Disposition: A | Discharge status: Home / Self Care | Payer: Medicare Other | Attending: Internal Medicine | Admitting: Internal Medicine

## 2015-06-19 ENCOUNTER — Emergency Department (HOSPITAL_COMMUNITY): Payer: Medicare Other

## 2015-06-19 DIAGNOSIS — E876 Hypokalemia: Secondary | ICD-10-CM | POA: Diagnosis not present

## 2015-06-19 DIAGNOSIS — I5022 Chronic systolic (congestive) heart failure: Secondary | ICD-10-CM | POA: Diagnosis present

## 2015-06-19 DIAGNOSIS — Z8249 Family history of ischemic heart disease and other diseases of the circulatory system: Secondary | ICD-10-CM

## 2015-06-19 DIAGNOSIS — J9602 Acute respiratory failure with hypercapnia: Secondary | ICD-10-CM | POA: Diagnosis present

## 2015-06-19 DIAGNOSIS — I480 Paroxysmal atrial fibrillation: Secondary | ICD-10-CM | POA: Diagnosis not present

## 2015-06-19 DIAGNOSIS — E874 Mixed disorder of acid-base balance: Secondary | ICD-10-CM | POA: Diagnosis not present

## 2015-06-19 DIAGNOSIS — Z87891 Personal history of nicotine dependence: Secondary | ICD-10-CM | POA: Diagnosis not present

## 2015-06-19 DIAGNOSIS — G9340 Encephalopathy, unspecified: Secondary | ICD-10-CM | POA: Diagnosis not present

## 2015-06-19 DIAGNOSIS — Z79899 Other long term (current) drug therapy: Secondary | ICD-10-CM | POA: Diagnosis not present

## 2015-06-19 DIAGNOSIS — I4891 Unspecified atrial fibrillation: Secondary | ICD-10-CM | POA: Insufficient documentation

## 2015-06-19 DIAGNOSIS — T45515A Adverse effect of anticoagulants, initial encounter: Secondary | ICD-10-CM | POA: Diagnosis not present

## 2015-06-19 DIAGNOSIS — J9621 Acute and chronic respiratory failure with hypoxia: Secondary | ICD-10-CM | POA: Diagnosis not present

## 2015-06-19 DIAGNOSIS — R0600 Dyspnea, unspecified: Secondary | ICD-10-CM | POA: Diagnosis not present

## 2015-06-19 DIAGNOSIS — R7989 Other specified abnormal findings of blood chemistry: Secondary | ICD-10-CM | POA: Diagnosis not present

## 2015-06-19 DIAGNOSIS — J9601 Acute respiratory failure with hypoxia: Secondary | ICD-10-CM | POA: Diagnosis present

## 2015-06-19 DIAGNOSIS — I429 Cardiomyopathy, unspecified: Secondary | ICD-10-CM | POA: Diagnosis not present

## 2015-06-19 DIAGNOSIS — J9600 Acute respiratory failure, unspecified whether with hypoxia or hypercapnia: Secondary | ICD-10-CM | POA: Diagnosis not present

## 2015-06-19 DIAGNOSIS — Z8673 Personal history of transient ischemic attack (TIA), and cerebral infarction without residual deficits: Secondary | ICD-10-CM

## 2015-06-19 DIAGNOSIS — I248 Other forms of acute ischemic heart disease: Secondary | ICD-10-CM | POA: Diagnosis present

## 2015-06-19 DIAGNOSIS — R319 Hematuria, unspecified: Secondary | ICD-10-CM | POA: Diagnosis not present

## 2015-06-19 DIAGNOSIS — N179 Acute kidney failure, unspecified: Secondary | ICD-10-CM | POA: Diagnosis not present

## 2015-06-19 DIAGNOSIS — I428 Other cardiomyopathies: Secondary | ICD-10-CM | POA: Diagnosis present

## 2015-06-19 DIAGNOSIS — J9690 Respiratory failure, unspecified, unspecified whether with hypoxia or hypercapnia: Secondary | ICD-10-CM | POA: Diagnosis not present

## 2015-06-19 DIAGNOSIS — I252 Old myocardial infarction: Secondary | ICD-10-CM

## 2015-06-19 DIAGNOSIS — J80 Acute respiratory distress syndrome: Secondary | ICD-10-CM | POA: Diagnosis not present

## 2015-06-19 DIAGNOSIS — Z7984 Long term (current) use of oral hypoglycemic drugs: Secondary | ICD-10-CM

## 2015-06-19 DIAGNOSIS — E785 Hyperlipidemia, unspecified: Secondary | ICD-10-CM | POA: Diagnosis present

## 2015-06-19 DIAGNOSIS — E119 Type 2 diabetes mellitus without complications: Secondary | ICD-10-CM | POA: Diagnosis not present

## 2015-06-19 DIAGNOSIS — Z833 Family history of diabetes mellitus: Secondary | ICD-10-CM

## 2015-06-19 DIAGNOSIS — I5023 Acute on chronic systolic (congestive) heart failure: Secondary | ICD-10-CM | POA: Diagnosis present

## 2015-06-19 DIAGNOSIS — R0602 Shortness of breath: Secondary | ICD-10-CM | POA: Diagnosis not present

## 2015-06-19 DIAGNOSIS — J9691 Respiratory failure, unspecified with hypoxia: Secondary | ICD-10-CM | POA: Diagnosis present

## 2015-06-19 DIAGNOSIS — E872 Acidosis, unspecified: Secondary | ICD-10-CM

## 2015-06-19 DIAGNOSIS — Z23 Encounter for immunization: Secondary | ICD-10-CM | POA: Diagnosis not present

## 2015-06-19 DIAGNOSIS — J810 Acute pulmonary edema: Secondary | ICD-10-CM

## 2015-06-19 DIAGNOSIS — R7309 Other abnormal glucose: Secondary | ICD-10-CM

## 2015-06-19 DIAGNOSIS — J811 Chronic pulmonary edema: Secondary | ICD-10-CM | POA: Diagnosis not present

## 2015-06-19 DIAGNOSIS — R069 Unspecified abnormalities of breathing: Secondary | ICD-10-CM | POA: Diagnosis not present

## 2015-06-19 DIAGNOSIS — I110 Hypertensive heart disease with heart failure: Secondary | ICD-10-CM | POA: Diagnosis not present

## 2015-06-19 DIAGNOSIS — R0603 Acute respiratory distress: Secondary | ICD-10-CM

## 2015-06-19 DIAGNOSIS — F1010 Alcohol abuse, uncomplicated: Secondary | ICD-10-CM | POA: Insufficient documentation

## 2015-06-19 DIAGNOSIS — E782 Mixed hyperlipidemia: Secondary | ICD-10-CM | POA: Diagnosis not present

## 2015-06-19 HISTORY — DX: Hyperlipidemia, unspecified: E78.5

## 2015-06-19 HISTORY — DX: Chronic systolic (congestive) heart failure: I50.22

## 2015-06-19 HISTORY — DX: Essential (primary) hypertension: I10

## 2015-06-19 HISTORY — DX: Acute myocardial infarction, unspecified: I21.9

## 2015-06-19 LAB — TROPONIN I
TROPONIN I: 0.12 ng/mL — AB (ref <0.031)
Troponin I: 0.03 ng/mL (ref <0.031)
Troponin I: 0.09 ng/mL — ABNORMAL HIGH (ref <0.031)

## 2015-06-19 LAB — ETHANOL

## 2015-06-19 LAB — BRAIN NATRIURETIC PEPTIDE
B Natriuretic Peptide: 506.6 pg/mL — ABNORMAL HIGH (ref 0.0–100.0)
B Natriuretic Peptide: 452.8 pg/mL — ABNORMAL HIGH (ref 0.0–100.0)

## 2015-06-19 LAB — I-STAT ARTERIAL BLOOD GAS, ED
ACID-BASE DEFICIT: 6 mmol/L — AB (ref 0.0–2.0)
Acid-base deficit: 11 mmol/L — ABNORMAL HIGH (ref 0.0–2.0)
BICARBONATE: 23.4 meq/L (ref 20.0–24.0)
Bicarbonate: 20.1 mEq/L (ref 20.0–24.0)
O2 SAT: 100 %
O2 Saturation: 94 %
PCO2 ART: 66.7 mmHg — AB (ref 35.0–45.0)
PH ART: 7.087 — AB (ref 7.350–7.450)
PO2 ART: 90 mmHg (ref 80.0–100.0)
TCO2: 22 mmol/L (ref 0–100)
TCO2: 25 mmol/L (ref 0–100)
pCO2 arterial: 60.4 mmHg (ref 35.0–45.0)
pH, Arterial: 7.197 — CL (ref 7.350–7.450)
pO2, Arterial: 277 mmHg — ABNORMAL HIGH (ref 80.0–100.0)

## 2015-06-19 LAB — BASIC METABOLIC PANEL
Anion gap: 16 — ABNORMAL HIGH (ref 5–15)
BUN: 15 mg/dL (ref 6–20)
CO2: 20 mmol/L — AB (ref 22–32)
Calcium: 8.9 mg/dL (ref 8.9–10.3)
Chloride: 105 mmol/L (ref 101–111)
Creatinine, Ser: 1.44 mg/dL — ABNORMAL HIGH (ref 0.61–1.24)
GFR calc Af Amer: 56 mL/min — ABNORMAL LOW (ref >60)
GFR, EST NON AFRICAN AMERICAN: 48 mL/min — AB (ref >60)
GLUCOSE: 125 mg/dL — AB (ref 65–99)
POTASSIUM: 4.5 mmol/L (ref 3.5–5.1)
Sodium: 141 mmol/L (ref 135–145)

## 2015-06-19 LAB — COMPREHENSIVE METABOLIC PANEL
ALBUMIN: 3.8 g/dL (ref 3.5–5.0)
ALK PHOS: 75 U/L (ref 38–126)
ALT: 43 U/L (ref 17–63)
ANION GAP: 19 — AB (ref 5–15)
AST: 137 U/L — ABNORMAL HIGH (ref 15–41)
BUN: 15 mg/dL (ref 6–20)
CALCIUM: 8.9 mg/dL (ref 8.9–10.3)
CHLORIDE: 104 mmol/L (ref 101–111)
CO2: 14 mmol/L — AB (ref 22–32)
CREATININE: 1.49 mg/dL — AB (ref 0.61–1.24)
GFR calc Af Amer: 53 mL/min — ABNORMAL LOW (ref >60)
GFR calc non Af Amer: 46 mL/min — ABNORMAL LOW (ref >60)
GLUCOSE: 344 mg/dL — AB (ref 65–99)
Potassium: 5 mmol/L (ref 3.5–5.1)
SODIUM: 137 mmol/L (ref 135–145)
Total Bilirubin: 1.2 mg/dL (ref 0.3–1.2)
Total Protein: 6.6 g/dL (ref 6.5–8.1)

## 2015-06-19 LAB — PROTIME-INR
INR: 1.09 (ref 0.00–1.49)
PROTHROMBIN TIME: 14.3 s (ref 11.6–15.2)

## 2015-06-19 LAB — CBC WITH DIFFERENTIAL/PLATELET
BASOS ABS: 0 10*3/uL (ref 0.0–0.1)
Basophils Relative: 0 %
Eosinophils Absolute: 0.1 10*3/uL (ref 0.0–0.7)
Eosinophils Relative: 1 %
HCT: 51 % (ref 39.0–52.0)
HEMOGLOBIN: 16.3 g/dL (ref 13.0–17.0)
LYMPHS PCT: 74 %
Lymphs Abs: 7.3 10*3/uL — ABNORMAL HIGH (ref 0.7–4.0)
MCH: 31.5 pg (ref 26.0–34.0)
MCHC: 32 g/dL (ref 30.0–36.0)
MCV: 98.6 fL (ref 78.0–100.0)
MONOS PCT: 5 %
Monocytes Absolute: 0.5 10*3/uL (ref 0.1–1.0)
NEUTROS PCT: 20 %
Neutro Abs: 2 10*3/uL (ref 1.7–7.7)
Platelets: 219 10*3/uL (ref 150–400)
RBC: 5.17 MIL/uL (ref 4.22–5.81)
RDW: 12.8 % (ref 11.5–15.5)
WBC: 9.9 10*3/uL (ref 4.0–10.5)

## 2015-06-19 LAB — PATHOLOGIST SMEAR REVIEW

## 2015-06-19 LAB — BLOOD GAS, ARTERIAL
Acid-base deficit: 0.1 mmol/L (ref 0.0–2.0)
BICARBONATE: 23.3 meq/L (ref 20.0–24.0)
DRAWN BY: 406621
FIO2: 0.5
O2 Saturation: 99 %
PATIENT TEMPERATURE: 99
PCO2 ART: 33.9 mmHg — AB (ref 35.0–45.0)
PEEP: 5 cmH2O
PH ART: 7.452 — AB (ref 7.350–7.450)
PO2 ART: 159 mmHg — AB (ref 80.0–100.0)
RATE: 20 resp/min
TCO2: 24.3 mmol/L (ref 0–100)
VT: 660 mL

## 2015-06-19 LAB — I-STAT TROPONIN, ED: Troponin i, poc: 0.02 ng/mL (ref 0.00–0.08)

## 2015-06-19 LAB — ECHOCARDIOGRAM COMPLETE
Height: 74 in
Weight: 3760 oz

## 2015-06-19 LAB — URINALYSIS, ROUTINE W REFLEX MICROSCOPIC
Bilirubin Urine: NEGATIVE
GLUCOSE, UA: NEGATIVE mg/dL
KETONES UR: NEGATIVE mg/dL
LEUKOCYTES UA: NEGATIVE
NITRITE: NEGATIVE
PH: 6 (ref 5.0–8.0)
Protein, ur: 30 mg/dL — AB
SPECIFIC GRAVITY, URINE: 1.014 (ref 1.005–1.030)

## 2015-06-19 LAB — GLUCOSE, CAPILLARY
GLUCOSE-CAPILLARY: 125 mg/dL — AB (ref 65–99)
GLUCOSE-CAPILLARY: 111 mg/dL — AB (ref 65–99)
Glucose-Capillary: 113 mg/dL — ABNORMAL HIGH (ref 65–99)

## 2015-06-19 LAB — URINE MICROSCOPIC-ADD ON

## 2015-06-19 LAB — PROCALCITONIN: Procalcitonin: <0.1 ng/mL

## 2015-06-19 LAB — HEPARIN LEVEL (UNFRACTIONATED)
HEPARIN UNFRACTIONATED: 0.52 [IU]/mL (ref 0.30–0.70)
Heparin Unfractionated: 0.7 IU/mL (ref 0.30–0.70)

## 2015-06-19 LAB — LACTIC ACID, PLASMA
LACTIC ACID, VENOUS: 2.9 mmol/L — AB (ref 0.5–2.0)
LACTIC ACID, VENOUS: 2.2 mmol/L — AB (ref 0.5–2.0)

## 2015-06-19 LAB — RAPID URINE DRUG SCREEN, HOSP PERFORMED
Amphetamines: NOT DETECTED
Barbiturates: NOT DETECTED
Benzodiazepines: POSITIVE — AB
Cocaine: NOT DETECTED
OPIATES: NOT DETECTED
TETRAHYDROCANNABINOL: POSITIVE — AB

## 2015-06-19 LAB — PHOSPHORUS: Phosphorus: 6.3 mg/dL — ABNORMAL HIGH (ref 2.5–4.6)

## 2015-06-19 LAB — I-STAT CG4 LACTIC ACID, ED: LACTIC ACID, VENOUS: 10.5 mmol/L — AB (ref 0.5–2.0)

## 2015-06-19 LAB — MRSA PCR SCREENING: MRSA BY PCR: NEGATIVE

## 2015-06-19 LAB — TSH: TSH: 3.974 u[IU]/mL (ref 0.350–4.500)

## 2015-06-19 LAB — MAGNESIUM: Magnesium: 2 mg/dL (ref 1.7–2.4)

## 2015-06-19 LAB — APTT: APTT: 28 s (ref 24–37)

## 2015-06-19 MED ORDER — FUROSEMIDE 10 MG/ML IJ SOLN
80.0000 mg | Freq: Once | INTRAMUSCULAR | Status: AC
  Administered 2015-06-19: 80 mg via INTRAVENOUS
  Filled 2015-06-19: qty 8

## 2015-06-19 MED ORDER — CHLORHEXIDINE GLUCONATE 0.12% ORAL RINSE (MEDLINE KIT)
15.0000 mL | Freq: Two times a day (BID) | OROMUCOSAL | Status: DC
  Administered 2015-06-19 – 2015-06-21 (×5): 15 mL via OROMUCOSAL

## 2015-06-19 MED ORDER — FENTANYL CITRATE (PF) 2500 MCG/50ML IJ SOLN
25.0000 ug/h | INTRAMUSCULAR | Status: DC
  Administered 2015-06-19 – 2015-06-20 (×3): 200 ug/h via INTRAVENOUS
  Filled 2015-06-19 (×3): qty 50

## 2015-06-19 MED ORDER — ASPIRIN 81 MG PO CHEW: 324.0000 mg | CHEWABLE_TABLET | Freq: Once | ORAL | Status: DC

## 2015-06-19 MED ORDER — MIDAZOLAM HCL 2 MG/2ML IJ SOLN: 1.0000 mg | INTRAMUSCULAR | Status: DC | PRN

## 2015-06-19 MED ORDER — SUCCINYLCHOLINE CHLORIDE 20 MG/ML IJ SOLN
INTRAMUSCULAR | Status: DC | PRN
  Administered 2015-06-19: 150 mg via INTRAVENOUS

## 2015-06-19 MED ORDER — FOLIC ACID 5 MG/ML IJ SOLN
1.0000 mg | Freq: Every day | INTRAMUSCULAR | Status: DC
  Administered 2015-06-19 – 2015-06-23 (×5): 1 mg via INTRAVENOUS
  Filled 2015-06-19: qty 0.2
  Filled 2015-06-19: qty 0
  Filled 2015-06-19 (×2): qty 0.2
  Filled 2015-06-19 (×2): qty 0
  Filled 2015-06-19: qty 0.2
  Filled 2015-06-19: qty 0
  Filled 2015-06-19: qty 0.2

## 2015-06-19 MED ORDER — FENTANYL CITRATE (PF) 100 MCG/2ML IJ SOLN
50.0000 ug | Freq: Once | INTRAMUSCULAR | Status: AC
  Administered 2015-06-19: 50 ug via INTRAVENOUS

## 2015-06-19 MED ORDER — HEPARIN BOLUS VIA INFUSION
4000.0000 [IU] | Freq: Once | INTRAVENOUS | Status: AC
  Administered 2015-06-19: 4000 [IU] via INTRAVENOUS
  Filled 2015-06-19: qty 4000

## 2015-06-19 MED ORDER — SODIUM CHLORIDE 0.9 % IV SOLN: 250.0000 mL | INTRAVENOUS | Status: DC | PRN

## 2015-06-19 MED ORDER — FENTANYL CITRATE (PF) 2500 MCG/50ML IJ SOLN
10.0000 ug/h | INTRAMUSCULAR | Status: DC
  Administered 2015-06-19: 25 ug/h via INTRAVENOUS
  Filled 2015-06-19: qty 50

## 2015-06-19 MED ORDER — FUROSEMIDE 10 MG/ML IJ SOLN
40.0000 mg | Freq: Once | INTRAMUSCULAR | Status: AC
  Administered 2015-06-19: 40 mg via INTRAVENOUS
  Filled 2015-06-19: qty 4

## 2015-06-19 MED ORDER — INSULIN ASPART 100 UNIT/ML ~~LOC~~ SOLN
0.0000 [IU] | SUBCUTANEOUS | Status: DC
  Administered 2015-06-19 – 2015-06-21 (×4): 2 [IU] via SUBCUTANEOUS
  Administered 2015-06-21 (×2): 3 [IU] via SUBCUTANEOUS
  Administered 2015-06-21 (×2): 2 [IU] via SUBCUTANEOUS
  Administered 2015-06-21: 3 [IU] via SUBCUTANEOUS
  Administered 2015-06-22 – 2015-06-23 (×2): 2 [IU] via SUBCUTANEOUS

## 2015-06-19 MED ORDER — PANTOPRAZOLE SODIUM 40 MG IV SOLR
40.0000 mg | Freq: Every day | INTRAVENOUS | Status: DC
  Administered 2015-06-19 – 2015-06-22 (×4): 40 mg via INTRAVENOUS
  Filled 2015-06-19 (×4): qty 40

## 2015-06-19 MED ORDER — ASPIRIN 81 MG PO CHEW
324.0000 mg | CHEWABLE_TABLET | Freq: Once | ORAL | Status: AC
  Administered 2015-06-19: 324 mg via ORAL
  Filled 2015-06-19: qty 4

## 2015-06-19 MED ORDER — ETOMIDATE 2 MG/ML IV SOLN
INTRAVENOUS | Status: DC | PRN
  Administered 2015-06-19: 20 mg via INTRAVENOUS

## 2015-06-19 MED ORDER — SODIUM CHLORIDE 0.9 % IV SOLN: INTRAVENOUS | Status: DC

## 2015-06-19 MED ORDER — ANTISEPTIC ORAL RINSE SOLUTION (CORINZ): 7.0000 mL | Freq: Four times a day (QID) | OROMUCOSAL | Status: DC

## 2015-06-19 MED ORDER — FENTANYL CITRATE (PF) 100 MCG/2ML IJ SOLN
200.0000 ug | Freq: Once | INTRAMUSCULAR | Status: AC
  Administered 2015-06-19: 200 ug via INTRAVENOUS

## 2015-06-19 MED ORDER — PANTOPRAZOLE SODIUM 40 MG IV SOLR: 40.0000 mg | Freq: Every day | INTRAVENOUS | Status: DC

## 2015-06-19 MED ORDER — PROPOFOL 1000 MG/100ML IV EMUL
INTRAVENOUS | Status: AC
  Administered 2015-06-19: 15 ug/kg/min via INTRAVENOUS
  Filled 2015-06-19: qty 100

## 2015-06-19 MED ORDER — ANTISEPTIC ORAL RINSE SOLUTION (CORINZ)
7.0000 mL | OROMUCOSAL | Status: DC
  Administered 2015-06-19 – 2015-06-21 (×22): 7 mL via OROMUCOSAL

## 2015-06-19 MED ORDER — THIAMINE HCL 100 MG/ML IJ SOLN
100.0000 mg | Freq: Every day | INTRAMUSCULAR | Status: DC
  Administered 2015-06-19 – 2015-06-23 (×5): 100 mg via INTRAVENOUS
  Filled 2015-06-19 (×5): qty 2

## 2015-06-19 MED ORDER — FENTANYL BOLUS VIA INFUSION
25.0000 ug | INTRAVENOUS | Status: DC | PRN
  Filled 2015-06-19: qty 25

## 2015-06-19 MED ORDER — MIDAZOLAM HCL 2 MG/2ML IJ SOLN
1.0000 mg | INTRAMUSCULAR | Status: DC | PRN
  Administered 2015-06-19: 1 mg via INTRAVENOUS

## 2015-06-19 MED ORDER — SODIUM CHLORIDE 0.9 % IV SOLN
3.0000 g | Freq: Four times a day (QID) | INTRAVENOUS | Status: DC
  Administered 2015-06-19 – 2015-06-21 (×8): 3 g via INTRAVENOUS
  Filled 2015-06-19 (×10): qty 3

## 2015-06-19 MED ORDER — MIDAZOLAM HCL 2 MG/2ML IJ SOLN
INTRAMUSCULAR | Status: AC
  Filled 2015-06-19: qty 2

## 2015-06-19 MED ORDER — PIPERACILLIN-TAZOBACTAM 3.375 G IVPB
3.3750 g | Freq: Three times a day (TID) | INTRAVENOUS | Status: DC
  Administered 2015-06-19: 3.375 g via INTRAVENOUS
  Filled 2015-06-19 (×3): qty 50

## 2015-06-19 MED ORDER — NITROGLYCERIN IN D5W 200-5 MCG/ML-% IV SOLN
0.0000 ug/min | Freq: Once | INTRAVENOUS | Status: AC
  Administered 2015-06-19: 10 ug/min via INTRAVENOUS
  Filled 2015-06-19: qty 250

## 2015-06-19 MED ORDER — INFLUENZA VAC SPLIT QUAD 0.5 ML IM SUSY
0.5000 mL | PREFILLED_SYRINGE | INTRAMUSCULAR | Status: DC
Start: 2015-06-20 — End: 2015-06-21
  Filled 2015-06-19: qty 0.5

## 2015-06-19 MED ORDER — CHLORHEXIDINE GLUCONATE 0.12% ORAL RINSE (MEDLINE KIT)
15.0000 mL | Freq: Two times a day (BID) | OROMUCOSAL | Status: DC
  Administered 2015-06-19: 15 mL via OROMUCOSAL

## 2015-06-19 MED ORDER — FENTANYL CITRATE (PF) 100 MCG/2ML IJ SOLN
INTRAMUSCULAR | Status: AC
  Filled 2015-06-19: qty 4

## 2015-06-19 MED ORDER — HEPARIN (PORCINE) IN NACL 100-0.45 UNIT/ML-% IJ SOLN
1300.0000 [IU]/h | INTRAMUSCULAR | Status: DC
  Administered 2015-06-19: 1300 [IU]/h via INTRAVENOUS
  Administered 2015-06-19: 1400 [IU]/h via INTRAVENOUS
  Filled 2015-06-19 (×3): qty 250

## 2015-06-19 NOTE — Care Management Note (Signed)
Case Management Note  Patient Details  Name: Michael Roberts MRN: [id number] Date of Birth: 1945-07-01  Subjective/Objective:  Adm w resp failure, vent                  Action/Plan: lives w wife, pcp dr guest   Expected Discharge Date:                  Expected Discharge Plan:  [place]  In-House Referral:     Discharge planning Services     Post Acute Care Choice:    Choice offered to:     DME Arranged:    DME Agency:     HH Arranged:    [place] Agency:     Status of Service:     Medicare Important Message Given:    Date Medicare IM Given:    Medicare IM give by:    Date Additional Medicare IM Given:    Additional Medicare Important Message give by:     If discussed at [place] of Stay Meetings, dates discussed:    Additional Comments: ur review done  Lacretia Leigh, RN 06/19/2015, 8:42 AM

## 2015-06-19 NOTE — Progress Notes (Signed)
RT NOTE:  Critical I-STAT ABG reported to Dr. Betsey Holiday. MD planning to intubate patient.

## 2015-06-19 NOTE — ED Provider Notes (Signed)
CSN: [id number]     Arrival date & time 06/19/15  [id number] History  By signing my name below, I, Altamease Oiler, attest that this documentation has been prepared under the direction and in the presence of Orpah Greek, MD. Electronically Signed: Altamease Oiler, ED Scribe. 06/19/2015. 3:57 AM   Chief Complaint  Patient presents with  . Respiratory Distress   The history is provided by the EMS personnel. No language interpreter was used.   Brought in by EMS on a CPAP machine, Michael Roberts is a 70 y.o. male with history of MI, irregular heart rhythm, DM, HTN, and stroke who presents to the Emergency Department complaining of constant SOB with onset this morning. He woke up from sleeping short of breath. On EMS arrival the patient had oxygen saturation of 42% on RA. Associated symptoms include chest pain. He was given 324 mg of Asprin PTA. His wife is at bedside and reports that he is a heavy everyday drinker. No known history of emphysema or COPD. Pt is a former smoker. His medical care is at the [facility].  Past Medical History  Diagnosis Date  . Diabetes mellitus without complication ([place])   . Stroke ([place])   . Myocardial infarction [facility])    History reviewed. No pertinent past surgical history. Family History  Problem Relation Age of Onset  . Diabetes Mother    Social History  Substance Use Topics  . Smoking status: Never Smoker   . Smokeless tobacco: None  . Alcohol Use: 0.0 oz/week    0 Standard drinks or equivalent per week     Comment: "drinks heavy" per wife    Review of Systems  Respiratory: Positive for shortness of breath.   Cardiovascular: Positive for chest pain.  All other systems reviewed and are negative.  Allergies  Review of patient's allergies indicates no known allergies.  Home Medications   Prior to Admission medications   Medication Sig Start Date End Date Taking? Authorizing Provider  carvedilol (COREG) 12.5 MG tablet Take 12.5 mg by mouth 2 (two)  times daily with a meal.    Historical Provider, MD  diclofenac sodium (VOLTAREN) 1 % GEL Apply 2 g topically 4 (four) times daily. 01/17/15   Tishira R Brewington, PA-C  glucose blood test strip 1 each by Other route as needed for other. Use as instructed    Historical Provider, MD  hydrochlorothiazide (HYDRODIURIL) 25 MG tablet Take 25 mg by mouth daily.    Historical Provider, MD  losartan (COZAAR) 100 MG tablet Take 100 mg by mouth daily.    Historical Provider, MD  metFORMIN (GLUCOPHAGE) 500 MG tablet Take by mouth 2 (two) times daily with a meal.    Historical Provider, MD  polyvinyl alcohol (LIQUIFILM TEARS) 1.4 % ophthalmic solution 1 drop as needed for dry eyes.    Historical Provider, MD  povidone-iodine (BETADINE) 0.3 % SOLN Place 1 each vaginally once.    Historical Provider, MD  simvastatin (ZOCOR) 20 MG tablet Take 20 mg by mouth daily.    Historical Provider, MD   BP 184/129 mmHg  Pulse 126  Temp(Src) 97.7 F (36.5 C) (Axillary)  Resp 34  Ht 6\' 1"  (1.854 m)  Wt 235 lb (106.595 kg)  BMI 31.01 kg/m2  SpO2 97% Physical Exam  Constitutional: He is oriented to person, place, and time. He appears well-developed and well-nourished. He appears distressed.  HENT:  Head: Normocephalic and atraumatic.  Right Ear: Hearing normal.  Left Ear: Hearing normal.  Nose:  Nose normal.  Mouth/Throat: Oropharynx is clear and moist and mucous membranes are normal.  Eyes: Conjunctivae and EOM are normal. Pupils are equal, round, and reactive to light.  Neck: Normal range of motion. Neck supple. JVD present.  Cardiovascular: Regular rhythm, S1 normal and S2 normal.  Tachycardia present.  Exam reveals no gallop and no friction rub.   No murmur heard. Pulmonary/Chest: He is in respiratory distress. He has rales. He exhibits no tenderness.  Abdominal: Soft. Normal appearance and bowel sounds are normal. There is no hepatosplenomegaly. There is no tenderness. There is no rebound, no guarding, no  tenderness at McBurney's point and negative Murphy's sign. No hernia.  Musculoskeletal: Normal range of motion. He exhibits edema.  Trace bilateral pitting edema  Neurological: He is alert and oriented to person, place, and time. He has normal strength. No cranial nerve deficit or sensory deficit. Coordination normal. GCS eye subscore is 4. GCS verbal subscore is 5. GCS motor subscore is 6.  Skin: Skin is warm and intact. No rash noted. He is diaphoretic. No cyanosis.  Psychiatric: He has a normal mood and affect. His speech is normal and behavior is normal. Thought content normal.  Nursing note and vitals reviewed.   ED Course  Procedures (including critical care time)   CRITICAL CARE Performed by: Orpah Greek.   Total critical care time: 35 minutes  Critical care time was exclusive of separately billable procedures and treating other patients.  Critical care was necessary to treat or prevent imminent or life-threatening deterioration.  Critical care was time spent personally by me on the following activities: development of treatment plan with patient and/or surrogate as well as nursing, discussions with consultants, evaluation of patient's response to treatment, examination of patient, obtaining history from patient or surrogate, ordering and performing treatments and interventions, ordering and review of laboratory studies, ordering and review of radiographic studies, pulse oximetry and re-evaluation of patient's condition.  COORDINATION OF CARE: 3:37 AM Treatment plan includes lab work, CXR, EKG, Lasix, and heparin.   Labs Review Labs Reviewed  CBC WITH DIFFERENTIAL/PLATELET - Abnormal; Notable for the following:    Lymphs Abs 7.3 (*)    All other components within normal limits  COMPREHENSIVE METABOLIC PANEL - Abnormal; Notable for the following:    CO2 14 (*)    Glucose, Bld 344 (*)    Creatinine, Ser 1.49 (*)    AST 137 (*)    GFR calc non Af Amer 46 (*)     GFR calc Af Amer 53 (*)    Anion gap 19 (*)    All other components within normal limits  BRAIN NATRIURETIC PEPTIDE - Abnormal; Notable for the following:    B Natriuretic Peptide 506.6 (*)    All other components within normal limits  URINALYSIS, ROUTINE W REFLEX MICROSCOPIC (NOT AT [facility]) - Abnormal; Notable for the following:    APPearance CLOUDY (*)    Hgb urine dipstick MODERATE (*)    Protein, ur 30 (*)    All other components within normal limits  URINE MICROSCOPIC-ADD ON - Abnormal; Notable for the following:    Squamous Epithelial / LPF 0-5 (*)    Bacteria, UA RARE (*)    Casts HYALINE CASTS (*)    All other components within normal limits  I-STAT ARTERIAL BLOOD GAS, ED - Abnormal; Notable for the following:    pH, Arterial 7.087 (*)    pCO2 arterial 66.7 (*)    pO2, Arterial 277.0 (*)  Acid-base deficit 11.0 (*)    All other components within normal limits  I-STAT CG4 LACTIC ACID, ED - Abnormal; Notable for the following:    Lactic Acid, Venous 10.50 (*)    All other components within normal limits  TROPONIN I  PROTIME-INR  APTT  HEPARIN LEVEL (UNFRACTIONATED)  LACTIC ACID, PLASMA  LACTIC ACID, PLASMA  TROPONIN I  TROPONIN I  I-STAT TROPOININ, ED  CBG MONITORING, ED    Imaging Review Dg Chest Port 1 View  06/19/2015  CLINICAL DATA:  Acute onset of respiratory distress. Initial encounter. EXAM: PORTABLE CHEST 1 VIEW COMPARISON:  Chest radiograph performed 01/06/2004 FINDINGS: The lungs are well-aerated. Vascular congestion is noted. Bilateral central and bibasilar airspace opacities may reflect pulmonary edema or pneumonia. There is no evidence of pleural effusion or pneumothorax. The cardiomediastinal silhouette is borderline normal in size. No acute osseous abnormalities are seen. IMPRESSION: Vascular congestion noted. Bilateral central and bibasilar airspace opacities may reflect pulmonary edema or pneumonia. Electronically Signed   By: Garald Balding M.D.   On:  06/19/2015 03:55   I have personally reviewed and evaluated these images and lab results as part of my medical decision-making.   EKG Interpretation   Date/Time:  Tuesday June 19 2015 03:31:42 EDT Ventricular Rate:  123 PR Interval:    QRS Duration: 151 QT Interval:  352 QTC Calculation: 503 R Axis:   -60 Text Interpretation:  Atrial fibrillation Left bundle branch block  Confirmed by POLLINA  MD, CHRISTOPHER [phone]) on 06/19/2015 4:46:51 AM      MDM   Final diagnoses:  Acute pulmonary edema (HCC)  Acute respiratory failure with hypoxia and hypercapnia (HCC)  Lactic acidosis  Atrial fibrillation with RVR ([place])    Patient presents to the ER for evaluation of sudden onset of severe shortness of breath. Patient was brought to the emergency department by EMS on CPAP. Patient was found at home gasping for breath with room air oxygen saturation 42%. Oxygenation did improve with CPAP, but patient was still in severe distress at arrival. He was breathing near 50 times a minute initially. He was given a short trial on BiPAP here in the ER, but continued to be in distress. Blood gas showed hypercarbia as well as profound mixed metabolic and respiratory acidosis. At this point it was decided to intubate him. Intubation was performed without difficulty.  CXR performed at arrival showed evidence of edema bilaterally. Cannot rule out pneumonia, but picture does not fit pneumonia. Patient is afebrile, has a normal white blood cell count. He had been doing well prior to going to bed last night. Secretions have been pink and frothy here in the ER. He was initiated on IV Lasix and a nitroglycerin drip at arrival.  Patient found to be in atrial fibrillation with rapid ventricular response at arrival as well. After initiation of treatment and sedation, heart rate came down into the 110 range. The atrial fibrillation was therefore not initially treated. He was started on heparin for his atrial  fibrillation as well as the fact that he was having chest pain and could not rule out acute coronary syndrome or even MI. His initial EKG shows left bundle branch block without crepitus criteria. We do not have an old EKG.  Patient has been difficult to sedate. Patient's wife reports that he is a heavy drinker. He became hypotensive with the tip in conjunction with the IV nitroglycerin. At this point to prevent was discontinued and he was started on a fentanyl  drip. Will add Versed as needed.  I personally performed the services described in this documentation, which was scribed in my presence. The recorded information has been reviewed and is accurate.    Orpah Greek, MD 06/19/15 [phone]

## 2015-06-19 NOTE — Progress Notes (Signed)
Initial Nutrition Assessment  DOCUMENTATION CODES:   Obesity unspecified  INTERVENTION:  -RD continue to monitor  NUTRITION DIAGNOSIS:   Inadequate oral intake related to inability to eat as evidenced by NPO status.  GOAL:   Provide needs based on ASPEN/SCCM guidelines  MONITOR:   Vent status, Labs, I & O's, Skin  REASON FOR ASSESSMENT:   Ventilator    ASSESSMENT:   MARC MIARS is a 71 y.o. male with PMH as outlined below. He was brought to [facility] ED via EMS early AM hours 03/28 with SOB that awoke him from sleep.  Patient is currently intubated on ventilator support MV: 13.7 L/min Temp (24hrs), Avg:98.6 F (37 C), Min:97.7 F (36.5 C), Max:99 F (37.2 C)  Propofol: none ml/hr  Pt intubated, sedated at bedside. No family present. Per chart review, pt arrived with SpO2 42% on RA. Placed on CPAP but became hypoxic, w/ resp acidosis. CXR revealed RLL PNA. Was intubated. Pt has hx of ETOH abuse.  Nutrition-Focused physical exam completed. Findings are no fat depletion, no muscle depletion, and no edema.   Labs: CBGs 113-125, Phos 6.3 Medications: Versed PRN, Fentanyl drip  Diet Order:  Diet NPO time specified  Skin:  Reviewed, no issues  Last BM:  PTA  Height:   Ht Readings from Last 1 Encounters:  06/19/15 6\' 2"  (1.88 m)    Weight:   Wt Readings from Last 1 Encounters:  06/19/15 235 lb (106.595 kg)    Ideal Body Weight:  86.36 kg  BMI:  Body mass index is 30.16 kg/(m^2).  Estimated Nutritional Needs:   Kcal:  [id number]  Protein:  130-160 gm  Fluid:  >/= 1.2L  EDUCATION NEEDS:   No education needs identified at this time  Satira Anis. Pratham Cassatt, MS, RD LDN After Hours/Weekend Pager [phone]

## 2015-06-19 NOTE — Progress Notes (Signed)
ANTICOAGULATION CONSULT NOTE - Initial Consult  Pharmacy Consult for heparin Indication: chest pain/ACS  No Known Allergies  Patient Measurements: Height: 6\' 1"  (185.4 cm) Weight: 235 lb 14.3 oz (107 kg) IBW/kg (Calculated) : 79.9 Heparin Dosing Weight: 105kg   Medical History: Past Medical History  Diagnosis Date  . Diabetes mellitus without complication ([place])   . Stroke [facility])     Assessment: 69yo c/o SOB and CP w/ significant cardiac PMH to begin heparin.  Goal of Therapy:  Heparin level 0.3-0.7 units/ml Monitor platelets by anticoagulation protocol: Yes   Plan:  Will give heparin 4000 units IV bolus x1 followed by gtt at 1400 units/hr and monitor heparin levels and CBC.  Wynona Neat, PharmD, BCPS  06/19/2015,3:47 AM

## 2015-06-19 NOTE — Progress Notes (Signed)
Pharmacy Antibiotic Note  Michael Roberts is a 70 y.o. male admitted on 06/19/2015 with aspiration.  Pharmacy has been consulted for Zosyn dosing.  Plan: Zosyn 3.375g IV q8h (4 hour infusion).  Height: 6\' 2"  (188 cm) (Verbal by Patient) Weight: 235 lb (106.595 kg) IBW/kg (Calculated) : 82.2  Temp (24hrs), Avg:98.4 F (36.9 C), Min:97.7 F (36.5 C), Max:98.8 F (37.1 C)   Recent Labs Lab 06/19/15 0345 06/19/15 0403  WBC 9.9  --   CREATININE 1.49*  --   LATICACIDVEN  --  10.50*    Estimated Creatinine Clearance: 60.9 mL/min (by C-G formula based on Cr of 1.49).    No Known Allergies    Thank you for allowing pharmacy to be a part of this patient's care.  Wynona Neat, PharmD, BCPS  06/19/2015 5:28 AM

## 2015-06-19 NOTE — Progress Notes (Signed)
RT NOTE:   Pt will be transported to 2H03 by Dub Mikes, RT. Report was given to [facility], RT.

## 2015-06-19 NOTE — Progress Notes (Signed)
Echocardiogram 2D Echocardiogram has been performed.  Michael Roberts 06/19/2015, 3:20 PM

## 2015-06-19 NOTE — Progress Notes (Signed)
Inpatient Diabetes Program Recommendations  AACE/ADA: New Consensus Statement on Inpatient Glycemic Control (2015)  Target Ranges:  Prepandial:   less than 140 mg/dL      Peak postprandial:   less than 180 mg/dL (1-2 hours)      Critically ill patients:  140 - 180 mg/dL   Results for Michael Roberts, Michael Roberts (MRN [id number]) as of 06/19/2015 07:50  Ref. Range 06/19/2015 03:45  Glucose Latest Ref Range: 65-99 mg/dL 344 (H)    Admit with: SOB/ Respiratory Failure  History: DM2, ETOH Abuse, MI  Home DM Meds: Metformin 500 mg bid  Current Insulin Orders: Novolog Moderate Correction Scale/ SSI (0-15 units) Q4 hours       -Note Novolog Moderate Correction Scale/ SSI (0-15 units) Q4 hours to start this AM.  -Currently Intubated.       MD- Please consider starting ICU Glycemic Control Protocol if you anticipate patient will be Intubated for several days     --Will follow patient during hospitalization--  Wyn Quaker RN, MSN, CDE Diabetes Coordinator Inpatient Glycemic Control Team Team Pager: [phone] (8a-5p)

## 2015-06-19 NOTE — ED Notes (Signed)
Patient presents from home via EMS.  Patient was awakened from sleep due to SOB.  Arrived on CPAP  CBG 180, BP 162/110 P110-120

## 2015-06-19 NOTE — H&P (Signed)
PULMONARY / CRITICAL CARE MEDICINE   Name: Michael Roberts MRN: [id number] DOB: 27-Feb-1946    ADMISSION DATE:  06/19/2015 CONSULTATION DATE:  06/19/15  REFERRING MD:  EDP  CHIEF COMPLAINT:  SOB  HISTORY OF PRESENT ILLNESS:  Pt is encephelopathic; therefore, this HPI is obtained from chart review. Michael Roberts is a 70 y.o. male with PMH as outlined below.  He was brought to [facility] ED via EMS early AM hours 03/28 with SOB that awoke him from sleep.  He had apparently been fine the night prior, and work up from sleep suddenly during early AM hours with SOB.  On EMS arrival, pt had SpO2 42% on RA and pt had also complained of chest pain. He was placed on CPAP and brought to ED for further evaluation.  In ED, he had persistent hypoxia and ABG revealed respiratory acidosis.  He was subsequently intubated and PCCM was called for admission.  CXR revealed vascular congestion and possible RLL PNA (? Aspiration).  Per ED RN, pt's wife stated that pt is a heavy everyday ETOH drinker.  PAST MEDICAL HISTORY :  He  has a past medical history of Diabetes mellitus without complication ([place]); Stroke [facility]); and Myocardial infarction ([place]).  PAST SURGICAL HISTORY: He  has no past surgical history on file.  No Known Allergies  Current Facility-Administered Medications on File Prior to Encounter  Medication  . triamcinolone acetonide (KENALOG-40) injection 40 mg   Current Outpatient Prescriptions on File Prior to Encounter  Medication Sig  . carvedilol (COREG) 12.5 MG tablet Take 12.5 mg by mouth 2 (two) times daily with a meal.  . diclofenac sodium (VOLTAREN) 1 % GEL Apply 2 g topically 4 (four) times daily.  Marland Kitchen glucose blood test strip 1 each by Other route as needed for other. Use as instructed  . hydrochlorothiazide (HYDRODIURIL) 25 MG tablet Take 25 mg by mouth daily.  Marland Kitchen losartan (COZAAR) 100 MG tablet Take 100 mg by mouth daily.  . metFORMIN (GLUCOPHAGE) 500 MG tablet Take by mouth 2 (two) times  daily with a meal.  . polyvinyl alcohol (LIQUIFILM TEARS) 1.4 % ophthalmic solution 1 drop as needed for dry eyes.  . povidone-iodine (BETADINE) 0.3 % SOLN Place 1 each vaginally once.  . simvastatin (ZOCOR) 20 MG tablet Take 20 mg by mouth daily.    FAMILY HISTORY:  His indicated that his mother is alive.   SOCIAL HISTORY: He  reports that he has never smoked. He does not have any smokeless tobacco history on file. He reports that he drinks alcohol. He reports that he does not use illicit drugs.  REVIEW OF SYSTEMS:   Unable to obtain as pt is encephalopathic.  SUBJECTIVE:  On vent, agitated.  VITAL SIGNS: BP 102/70 mmHg  Pulse 42  Temp(Src) 98.8 F (37.1 C) (Axillary)  Resp 16  Ht 6\' 2"  (1.88 m)  Wt 106.595 kg (235 lb)  BMI 30.16 kg/m2  SpO2 91%  HEMODYNAMICS:    VENTILATOR SETTINGS: Vent Mode:  [-] PRVC FiO2 (%):  [60 %-100 %] 60 % Set Rate:  [10 bmp-18 bmp] 18 bmp Vt Set:  [id number] mL] 660 mL PEEP:  [5 cmH20-6 cmH20] 5 cmH20 Plateau Pressure:  [18 cmH20] 18 cmH20  INTAKE / OUTPUT:     PHYSICAL EXAMINATION: General: Adult male, in NAD. Neuro: Awake and agitated despite sedation. HEENT: [place]/AT. PERRL, sclerae anicteric. Cardiovascular: IRIR, no M/R/G.  Lungs: Respirations even and unlabored.  Coarse crackles bilaterally. Abdomen: BS x 4, soft,  NT/ND.  Musculoskeletal: No gross deformities, no edema.  Skin: Intact, warm, no rashes.  LABS:  BMET  Recent Labs Lab 06/19/15 0345  NA 137  K 5.0  CL 104  CO2 14*  BUN 15  CREATININE 1.49*  GLUCOSE 344*    Electrolytes  Recent Labs Lab 06/19/15 0345  CALCIUM 8.9    CBC  Recent Labs Lab 06/19/15 0345  WBC 9.9  HGB 16.3  HCT 51.0  PLT 219    Coag's  Recent Labs Lab 06/19/15 0345  APTT 28  INR 1.09    Sepsis Markers  Recent Labs Lab 06/19/15 0403  LATICACIDVEN 10.50*    ABG  Recent Labs Lab 06/19/15 0353  PHART 7.087*  PCO2ART 66.7*  PO2ART 277.0*    Liver  Enzymes  Recent Labs Lab 06/19/15 0345  AST 137*  ALT 43  ALKPHOS 75  BILITOT 1.2  ALBUMIN 3.8    Cardiac Enzymes  Recent Labs Lab 06/19/15 0345  TROPONINI 0.03    Glucose No results for input(s): GLUCAP in the last 168 hours.  Imaging Dg Chest Port 1 View  06/19/2015  CLINICAL DATA:  Acute onset of respiratory distress. Initial encounter. EXAM: PORTABLE CHEST 1 VIEW COMPARISON:  Chest radiograph performed 01/06/2004 FINDINGS: The lungs are well-aerated. Vascular congestion is noted. Bilateral central and bibasilar airspace opacities may reflect pulmonary edema or pneumonia. There is no evidence of pleural effusion or pneumothorax. The cardiomediastinal silhouette is borderline normal in size. No acute osseous abnormalities are seen. IMPRESSION: Vascular congestion noted. Bilateral central and bibasilar airspace opacities may reflect pulmonary edema or pneumonia. Electronically Signed   By: Garald Balding M.D.   On: 06/19/2015 03:55     STUDIES:  CXR 03/28 > vascular congestion with possible RLL PNA.  CULTURES: Blood 03/28 > Sputum 03/28 >  ANTIBIOTICS: Zosyn 03/28 >  SIGNIFICANT EVENTS: 03/28 > admitted with acute hypoxemic and hypercapnic respiratory failure requiring intubation.  LINES/TUBES: ETT 03/28 >  DISCUSSION: 70 y.o. M admitted 03/28 with acute hypoxemic and hypercapnic respiratory failure requiring intubation.  Concern for aspiration given his heavy ETOH use.  ASSESSMENT / PLAN:  PULMONARY A: Acute hypoxemic and hypercapnic respiratory failure. Respiratory acidosis. Pulmonary edema - s/p 80mg  lasix in ED. Concern for aspiration. P:   Full vent support. Wean as able. VAP prevention measures. SBT in AM if able. Abx per ID section. CXR in AM.  CARDIOVASCULAR A:  A.fib - ? New onset; no hx noted in system. Chest pain - initial troponin neg and EKG without obvious ischemic changes. Hx sCHF (echo from 2010 with EF 25-30%), MI. P:  Continue  heparin gtt. Trend troponin, lactate. Assess echo, TSH. Consider cards consult.  RENAL A:   AGMA - lactate. AKI. P:   NS @ 75. BMP in AM.  GASTROINTESTINAL A:   Elevated AST - due to ETOH use. GI prophylaxis. Nutrition. P:   SUP: Pantoprazole. NPO. ETOH cessation counseling once extubated.  HEMATOLOGIC A:   VTE Prophylaxis. P:  SCD's / heparin gtt. CBC in AM.  INFECTIOUS A:   Concern for aspiration PNA. P:   Abx as above (zosyn).  Follow cultures as above. Assess PCT - if low, consider d/x abx.  ENDOCRINE A:   DM. P:   SSI. Assess TSH.  NEUROLOGIC A:   Acute encephalopathy - due to hypercarbia + sedation. Hx ETOH use. P:   Sedation:  Fentanyl gtt / Midazolam PRN. RASS goal: 0 to -1. Daily WUA. Thiamine / Folate.  Family updated: None.  Interdisciplinary Family Meeting v Palliative Care Meeting:  Due by: 04/03.  CC time: 30 minutes.   Montey Hora, [place] [facility] Pager: [phone]  or [phone] 06/19/2015, 5:22 AM

## 2015-06-19 NOTE — Progress Notes (Signed)
ANTICOAGULATION CONSULT NOTE - Follow Up Consult  Pharmacy Consult for heparin Indication: chest pain/ACS  No Known Allergies  Patient Measurements: Height: 6\' 2"  (188 cm) (Verbal by Patient) Weight: 235 lb (106.595 kg) IBW/kg (Calculated) : 82.2 Heparin Dosing Weight: 102kg  Vital Signs: Temp: 98.2 F (36.8 C) (03/28 1231) Temp Source: Oral (03/28 1231) BP: 85/61 mmHg (03/28 1108) Pulse Rate: 59 (03/28 1108)  Labs:  Recent Labs  06/19/15 0345 06/19/15 0930 06/19/15 0936 06/19/15 1200  HGB 16.3  --   --   --   HCT 51.0  --   --   --   PLT 219  --   --   --   APTT 28  --   --   --   LABPROT 14.3  --   --   --   INR 1.09  --   --   --   HEPARINUNFRC  --  0.70  --   --   CREATININE 1.49*  --  1.44*  --   TROPONINI 0.03  --  0.09* 0.12*    Estimated Creatinine Clearance: 63 mL/min (by C-G formula based on Cr of 1.44).   Assessment: 70 year old male with history of DM, stroke, MI presents to [facility] with SOB and chest pain. CE's now low positive and patient started on IV heparin. Not on any anticoagulants prior to admission.  Initial heparin level is at upper end of goal (0.7), initial cbc was within normal limits and no bleeding issues have been noted.  Note that abx were narrowed from zosyn to unasyn. No dose adjustments needed.  Goal of Therapy:  Heparin level 0.3-0.7 units/ml Monitor platelets by anticoagulation protocol: Yes   Plan:  Reduce heparin infusion to 1300 units/hr Check anti-Xa level in 8 hours and daily while on heparin Continue to monitor H&H and platelets  Erin Hearing PharmD., BCPS Clinical Pharmacist Pager [phone] 06/19/2015 1:56 PM

## 2015-06-20 ENCOUNTER — Inpatient Hospital Stay (HOSPITAL_COMMUNITY): Payer: Medicare Other

## 2015-06-20 ENCOUNTER — Encounter (HOSPITAL_COMMUNITY): Payer: Self-pay | Admitting: Student

## 2015-06-20 DIAGNOSIS — I5022 Chronic systolic (congestive) heart failure: Secondary | ICD-10-CM | POA: Diagnosis present

## 2015-06-20 DIAGNOSIS — I429 Cardiomyopathy, unspecified: Secondary | ICD-10-CM

## 2015-06-20 DIAGNOSIS — I428 Other cardiomyopathies: Secondary | ICD-10-CM | POA: Diagnosis present

## 2015-06-20 DIAGNOSIS — I4891 Unspecified atrial fibrillation: Secondary | ICD-10-CM

## 2015-06-20 LAB — GLUCOSE, CAPILLARY
GLUCOSE-CAPILLARY: 123 mg/dL — AB (ref 65–99)
GLUCOSE-CAPILLARY: 127 mg/dL — AB (ref 65–99)
GLUCOSE-CAPILLARY: 130 mg/dL — AB (ref 65–99)
GLUCOSE-CAPILLARY: 137 mg/dL — AB (ref 65–99)
Glucose-Capillary: 120 mg/dL — ABNORMAL HIGH (ref 65–99)
Glucose-Capillary: 133 mg/dL — ABNORMAL HIGH (ref 65–99)
Glucose-Capillary: 128 mg/dL — ABNORMAL HIGH (ref 65–99)

## 2015-06-20 LAB — HEPARIN LEVEL (UNFRACTIONATED): Heparin Unfractionated: 0.48 IU/mL (ref 0.30–0.70)

## 2015-06-20 LAB — BLOOD GAS, ARTERIAL
ACID-BASE EXCESS: 1.6 mmol/L (ref 0.0–2.0)
BICARBONATE: 24.2 meq/L — AB (ref 20.0–24.0)
DRAWN BY: 10006
FIO2: 0.4
O2 SAT: 98.1 %
PATIENT TEMPERATURE: 98.6
PCO2 ART: 28.8 mmHg — AB (ref 35.0–45.0)
PEEP/CPAP: 5 cmH2O
PH ART: 7.534 — AB (ref 7.350–7.450)
RATE: 20 resp/min
TCO2: 25.1 mmol/L (ref 0–100)
VT: 660 mL
pO2, Arterial: 103 mmHg — ABNORMAL HIGH (ref 80.0–100.0)

## 2015-06-20 LAB — CBC
HEMATOCRIT: 42.9 % (ref 39.0–52.0)
HEMOGLOBIN: 14.4 g/dL (ref 13.0–17.0)
MCH: 31.4 pg (ref 26.0–34.0)
MCHC: 33.6 g/dL (ref 30.0–36.0)
MCV: 93.5 fL (ref 78.0–100.0)
Platelets: 198 10*3/uL (ref 150–400)
RBC: 4.59 MIL/uL (ref 4.22–5.81)
RDW: 13.1 % (ref 11.5–15.5)
WBC: 7.6 10*3/uL (ref 4.0–10.5)

## 2015-06-20 LAB — BASIC METABOLIC PANEL
Anion gap: 13 (ref 5–15)
BUN: 15 mg/dL (ref 6–20)
CHLORIDE: 104 mmol/L (ref 101–111)
CO2: 24 mmol/L (ref 22–32)
CREATININE: 1.03 mg/dL (ref 0.61–1.24)
Calcium: 8.6 mg/dL — ABNORMAL LOW (ref 8.9–10.3)
GFR calc Af Amer: >60 mL/min (ref >60)
GFR calc non Af Amer: >60 mL/min (ref >60)
Glucose, Bld: 126 mg/dL — ABNORMAL HIGH (ref 65–99)
POTASSIUM: 3.1 mmol/L — AB (ref 3.5–5.1)
SODIUM: 141 mmol/L (ref 135–145)

## 2015-06-20 LAB — POTASSIUM: POTASSIUM: 3.6 mmol/L (ref 3.5–5.1)

## 2015-06-20 LAB — PHOSPHORUS: Phosphorus: 2.7 mg/dL (ref 2.5–4.6)

## 2015-06-20 LAB — MAGNESIUM: Magnesium: 1.9 mg/dL (ref 1.7–2.4)

## 2015-06-20 MED ORDER — VITAL HIGH PROTEIN PO LIQD
1000.0000 mL | ORAL | Status: DC
  Administered 2015-06-20: 1000 mL

## 2015-06-20 MED ORDER — DEXMEDETOMIDINE HCL IN NACL 200 MCG/50ML IV SOLN
0.4000 ug/kg/h | INTRAVENOUS | Status: DC
  Administered 2015-06-20: 0.4 ug/kg/h via INTRAVENOUS
  Filled 2015-06-20: qty 50

## 2015-06-20 MED ORDER — MIDAZOLAM HCL 2 MG/2ML IJ SOLN: 1.0000 mg | Freq: Once | INTRAMUSCULAR | Status: AC

## 2015-06-20 MED ORDER — DEXMEDETOMIDINE HCL IN NACL 400 MCG/100ML IV SOLN
0.4000 ug/kg/h | INTRAVENOUS | Status: DC
  Administered 2015-06-20: 0.4 ug/kg/h via INTRAVENOUS
  Administered 2015-06-20: 0.5 ug/kg/h via INTRAVENOUS
  Administered 2015-06-21: 1 ug/kg/h via INTRAVENOUS
  Administered 2015-06-21 (×2): 0.8 ug/kg/h via INTRAVENOUS
  Administered 2015-06-21 – 2015-06-22 (×2): 0.4 ug/kg/h via INTRAVENOUS
  Filled 2015-06-20 (×8): qty 100

## 2015-06-20 MED ORDER — POTASSIUM CHLORIDE 20 MEQ/15ML (10%) PO SOLN
30.0000 meq | ORAL | Status: DC
  Administered 2015-06-20: 30 meq
  Filled 2015-06-20: qty 30

## 2015-06-20 MED ORDER — MIDAZOLAM HCL 2 MG/2ML IJ SOLN
INTRAMUSCULAR | Status: AC
  Filled 2015-06-20: qty 2

## 2015-06-20 MED ORDER — MAGNESIUM SULFATE 2 GM/50ML IV SOLN
2.0000 g | Freq: Once | INTRAVENOUS | Status: AC
  Administered 2015-06-20: 2 g via INTRAVENOUS
  Filled 2015-06-20: qty 50

## 2015-06-20 MED ORDER — LORAZEPAM 2 MG/ML IJ SOLN: 1.0000 mg | INTRAMUSCULAR | Status: DC | PRN

## 2015-06-20 MED ORDER — POTASSIUM CHLORIDE CRYS ER 20 MEQ PO TBCR
40.0000 meq | EXTENDED_RELEASE_TABLET | Freq: Once | ORAL | Status: AC
  Administered 2015-06-20: 40 meq via ORAL
  Filled 2015-06-20: qty 2

## 2015-06-20 MED ORDER — PRO-STAT SUGAR FREE PO LIQD
60.0000 mL | Freq: Four times a day (QID) | ORAL | Status: DC
  Administered 2015-06-20 (×3): 60 mL
  Filled 2015-06-20: qty 60

## 2015-06-20 NOTE — Progress Notes (Signed)
1700 checked pt output in catheter. Upon providing foley/peri care visualized blood at tip of penis. carefully cleaned site. Flushed foley with 10cc. 200cc Bloody irine immediately came back into foley collection container. Heparin turned off. CCM MD notified. SCDs ordered and placed. Will continue to monitor output.  Asencion Noble RN

## 2015-06-20 NOTE — Progress Notes (Addendum)
t

## 2015-06-20 NOTE — Progress Notes (Signed)
Nutrition Follow-up  DOCUMENTATION CODES:   Obesity unspecified  INTERVENTION:   Initiate Vital High Protein @ 25 ml/hr via OG tube.   60 ml Prostat QID.    Tube feeding regimen provides 1400 kcal , 172 grams of protein, and 501 ml of H2O.   NUTRITION DIAGNOSIS:   Inadequate oral intake related to inability to eat as evidenced by NPO status. Ongoing.   GOAL:   Provide needs based on ASPEN/SCCM guidelines Progressing.   MONITOR:   TF tolerance, Vent status, Labs  REASON FOR ASSESSMENT:   Consult Enteral/tube feeding initiation and management  ASSESSMENT:   Michael Roberts is a 70 y.o. male with PMH as outlined below. He was brought to [facility] ED via EMS early AM hours 03/28 with SOB that awoke him from sleep.  Patient is currently intubated on ventilator support MV: 13.5 L/min Temp (24hrs), Avg:98.8 F (37.1 C), Min:98.4 F (36.9 C), Max:99 F (37.2 C)  Spoke with Therapist, sports. Pt will remain intubated today. Pt awake on vent, now on CIWA protocol.  Medications reviewed and include: folic acid, thiamine Labs reviewed: potassium 3.1 CBG's: 130-137  Diet Order:  Diet NPO time specified  Skin:  Reviewed, no issues  Last BM:  unknown  Height:   Ht Readings from Last 1 Encounters:  06/19/15 6\' 2"  (1.88 m)    Weight:   Wt Readings from Last 1 Encounters:  06/20/15 235 lb 0.2 oz (106.6 kg)    Ideal Body Weight:  86.36 kg  BMI:  Body mass index is 30.16 kg/(m^2).  Estimated Nutritional Needs:   Kcal:  [id number]  Protein:  >/= 172 grams  Fluid:  >/= 1.2L  EDUCATION NEEDS:   No education needs identified at this time  [place], [place], [place] Pager [phone] After Hours Pager

## 2015-06-20 NOTE — Progress Notes (Signed)
ANTICOAGULATION CONSULT NOTE - Follow Up Consult  Pharmacy Consult for heparin Indication: chest pain/ACS  No Known Allergies  Patient Measurements: Height: 6\' 2"  (188 cm) (Verbal by Patient) Weight: 235 lb 0.2 oz (106.6 kg) IBW/kg (Calculated) : 82.2 Heparin Dosing Weight: 102kg  Vital Signs: Temp: 98.4 F (36.9 C) (03/29 0727) Temp Source: Oral (03/29 0727) BP: 96/72 mmHg (03/29 0900) Pulse Rate: 57 (03/29 0900)  Labs:  Recent Labs  06/19/15 0345 06/19/15 0930 06/19/15 0936 06/19/15 1200 06/19/15 2149 06/20/15 0420  HGB 16.3  --   --   --   --  14.4  HCT 51.0  --   --   --   --  42.9  PLT 219  --   --   --   --  198  APTT 28  --   --   --   --   --   LABPROT 14.3  --   --   --   --   --   INR 1.09  --   --   --   --   --   HEPARINUNFRC  --  0.70  --   --  0.52 0.48  CREATININE 1.49*  --  1.44*  --   --  1.03  TROPONINI 0.03  --  0.09* 0.12*  --   --     Estimated Creatinine Clearance: 88.1 mL/min (by C-G formula based on Cr of 1.03).   Assessment: 70 year old male with history of DM, stroke, MI presents to [facility] with SOB and chest pain. CE's now low positive and patient on IV heparin. Not on any anticoagulants prior to admission. -heparin level= 0.48, Hg= 14.4, plt= 198  Goal of Therapy:  Heparin level 0.3-0.7 units/ml Monitor platelets by anticoagulation protocol: Yes   Plan:  -No heparin changes needed -Daily heparin level and CBC  Hildred Laser, Pharm D 06/20/2015 10:13 AM

## 2015-06-20 NOTE — Consult Note (Signed)
Cardiology Consult    Patient ID: Michael Roberts MRN: [id number], DOB/AGE: 10/16/1945   Admit date: 06/19/2015 Date of Consult: 06/20/2015  Primary Physician: Kennon Portela, MD Reason for Consult: Atrial Fibrillation; Chest Pain Primary Cardiologist: Previously seen by Dr. Caryl Comes (2010) - Followed by the [facility]. Requesting Provider: Dr. Corrie Dandy   History of Present Illness    Michael Roberts is a 70 y.o. male with past medical history of chronic systolic CHF (EF 28% by echo in 2010), Type 2 DM, HTN, alcohol abuse, and CVA who presented to [facility] ED on 06/19/2015 for shortness of breath and chest pain since earlier that morning.   By review of records, his oxygen saturation were at 42% on RA when EMS arrived and he was placed on CPAP. In the ED, his blood gas showed a pH of 7.087 with CO2 of 66.7 and he was intubated. EKG showed atrial fibrillation with RVR with HR of 123 and a LBBB (no previous tracings available for comparison). Heparin was started at that time. CXR showed vascular congestion with bilateral central and bibasilar airspace opacities which may reflect pulmonary edema or pneumonia. He was started on Unasyn per CCM.  Overnight, he converted to NSR and an EKG confirms this. Cyclic troponin values have been 0.03, 0.09, and 0.12. K+ 3.1. Creatinine 1.03. WBC 7.6. Hgb 14.4. Platelets 198. UDS positive for Benzodiazepines and THC.  The patient is currently intubated but awake. He communicates by writing on a notepad and his wife is at the bedside contributing to the history.  By report, he is an active individual, lifting weights several times per week and walking daily. He was doing well and had no symptoms until he became short of breath and diaphoretic yesterday morning. He had a pressure in his chest at that time as well. Denies any prior history of chest pain or discomfort.  He is currently followed by the [facility] in [place]. Our records show his  last echocardiogram in 2010 showed an EF of 30% and a cardiac catheterization was recommended for further assessment. The patient and his wife report this was performed in 2010 and was "normal".  He reports his sister had an MI in her 37's. Social history is significant for 2 shots of alcohol per day as report by the patient, but his wife had told nursing staff his intake is significantly more. Also uses Marijuana weekly. Denies any prior tobacco use.   Past Medical History   Past Medical History  Diagnosis Date  . Diabetes mellitus without complication ([place])   . Stroke ([place])   . Myocardial infarction [facility])     History reviewed. No pertinent past surgical history.   Allergies  No Known Allergies  Inpatient Medications    . ampicillin-sulbactam (UNASYN) IV  3 g Intravenous Q6H  . antiseptic oral rinse  7 mL Mouth Rinse 10 times per day  . chlorhexidine gluconate (SAGE KIT)  15 mL Mouth Rinse BID  . folic acid  1 mg Intravenous Daily  . Influenza vac split quadrivalent PF  0.5 mL Intramuscular Tomorrow-1000  . insulin aspart  0-15 Units Subcutaneous 6 times per day  . pantoprazole (PROTONIX) IV  40 mg Intravenous QHS  . thiamine  100 mg Intravenous Daily    Family History    Family History  Problem Relation Age of Onset  . Diabetes Mother     Social History    Social History   Social History  . Marital  Status: Married    Spouse Name: N/A  . Number of Children: N/A  . Years of Education: N/A   Occupational History  . Not on file.   Social History Main Topics  . Smoking status: Never Smoker   . Smokeless tobacco: Not on file  . Alcohol Use: 0.0 oz/week    0 Standard drinks or equivalent per week     Comment: "drinks heavy" per wife  . Drug Use: No  . Sexual Activity: Not on file   Other Topics Concern  . Not on file   Social History Narrative     Review of Systems    General:  No chills, fever, night sweats or weight changes.  Cardiovascular:  No  dyspnea on exertion, edema, orthopnea, palpitations, paroxysmal nocturnal dyspnea. Positive for chest pain, dyspnea, and diaphoresis. Dermatological: No rash, lesions/masses Respiratory: No cough, Positive for dyspnea. Urologic: No hematuria, dysuria Abdominal:   No nausea, vomiting, diarrhea, bright red blood per rectum, melena, or hematemesis Neurologic:  No visual changes, wkns, changes in mental status. All other systems reviewed and are otherwise negative except as noted above.  Physical Exam    Blood pressure 96/72, pulse 57, temperature 98.4 F (36.9 C), temperature source Oral, resp. rate 21, height _0  (1.88 m), weight 235 lb 0.2 oz (106.6 kg), SpO2 100 %.  General: Pleasant, African American male appearing in NAD. Currently intubated but responds to questions by writing on a tablet. Psych: Normal affect. Neuro: Alert and oriented X 3. Moves all extremities spontaneously. HEENT: Normal  Neck: Supple without bruits or JVD. Lungs:  Resp regular and unlabored, CTA without wheezing or rales. Heart: RRR, no s3, s4, 2/6 SEM at [place]. Abdomen: Soft, non-tender, non-distended, BS + x 4.  Extremities: No clubbing, cyanosis or edema. DP/PT/Radials 2+ and equal bilaterally.  Labs    Troponin [facility] of Care Test)  Recent Labs  06/19/15 0344  TROPIPOC 0.02    Recent Labs  06/19/15 0345 06/19/15 0936 06/19/15 1200  TROPONINI 0.03 0.09* 0.12*   Lab Results  Component Value Date   WBC 7.6 07/15/2015   HGB 14.4 2015-07-15   HCT 42.9 07-15-15   MCV 93.5 07-15-2015   PLT 198 Jul 15, 2015    Recent Labs Lab 06/19/15 0345  2015-07-15 0420  NA 137  < > 141  K 5.0  < > 3.1*  CL 104  < > 104  CO2 14*  < > 24  BUN 15  < > 15  CREATININE 1.49*  < > 1.03  CALCIUM 8.9  < > 8.6*  PROT 6.6  --   --   BILITOT 1.2  --   --   ALKPHOS 75  --   --   ALT 43  --   --   AST 137*  --   --   GLUCOSE 344*  < > 126*  < > = values in this interval not displayed. No results found for:  CHOL, HDL, LDLCALC, TRIG No results found for: [facility]   Radiology Studies    Dg Chest Port 1 View: 2015/07/15  CLINICAL DATA:  Respiratory failure. EXAM: PORTABLE CHEST 1 VIEW COMPARISON:  06/19/2015. FINDINGS: Endotracheal tube and NG tube in good anatomic position. Mediastinum and hilar structures are normal. Persistent cardiomegaly. Improvement of pulmonary venous congestion . Interim partial clearing of bilateral pulmonary infiltrates/edema. Mild residual interstitial edema. Low lung volumes with mild bibasilar atelectasis. Small left pleural effusion cannot be excluded. No pneumothorax . IMPRESSION: 1. Endotracheal tube  and NG tube noted in good anatomic position. 2. Stable cardiomegaly. Interim improvement pulmonary venous congestion bilateral pulmonary infiltrates/edema consistent with improving congestive heart failure. Mild residual interstitial edema. Right lung base subsegmental atelectasis. Electronically Signed   By: Marcello Moores  Register   On: 06/20/2015 07:17   Dg Chest Port 1 View: 06/19/2015  CLINICAL DATA:  Acute onset of respiratory distress. Initial encounter. EXAM: PORTABLE CHEST 1 VIEW COMPARISON:  Chest radiograph performed 01/06/2004 FINDINGS: The lungs are well-aerated. Vascular congestion is noted. Bilateral central and bibasilar airspace opacities may reflect pulmonary edema or pneumonia. There is no evidence of pleural effusion or pneumothorax. The cardiomediastinal silhouette is borderline normal in size. No acute osseous abnormalities are seen. IMPRESSION: Vascular congestion noted. Bilateral central and bibasilar airspace opacities may reflect pulmonary edema or pneumonia. Electronically Signed   By: Garald Balding M.D.   On: 06/19/2015 03:55    EKG & Cardiac Imaging    EKG: Sinus bradycardia, HR 57. LAD with LVH present. Nonspecific ST abnormality in inferior-lateral leads.  Echocardiogram: 06/19/2015 Study Conclusions - Left ventricle: The cavity size was normal. There was  mild  concentric hypertrophy. Systolic function was moderately reduced.  The estimated ejection fraction was in the range of 25% to 30%.  Diffuse hypokinesis. Akinesis of the inferior and inferoseptal  myocardium. Doppler parameters are consistent with abnormal left  ventricular relaxation (grade 1 diastolic dysfunction). Doppler  parameters are consistent with high ventricular filling pressure. - Aortic valve: Transvalvular velocity was within the normal range.  There was no stenosis. There was mild regurgitation. - Aorta: Ascending aortic AP diameter: 3.9 mm. - Ascending aorta: The ascending aorta was mildly dilated. - Mitral valve: There was mild regurgitation. - Right ventricle: The cavity size was normal. Wall thickness was  normal. Systolic function was normal. - Tricuspid valve: Structurally normal valve. There was no  regurgitation. - Inferior vena cava: The vessel was normal in size. The  respirophasic diameter changes were in the normal range (>= 50%),  consistent with normal central venous pressure.  Assessment & Plan    1. Chest Pain/ Acute Dyspnea - presented with acute shortness of breath, chest pressure, and diaphoresis. O2 saturation was initially in the 40's. Was placed on CPAP by EMS and intubated in the ED. - denies any prior history of chest discomfort. - cyclic troponin values have been 0.03, 0.09, and 0.12. EKG shows no acute ischemic changes. - last cardiac catheterization was in 2010 and "normal" by report of the patient. - will recommend a repeat cardiac catheterization this admission. Discussed with Dr. Buena Irish, with his non-acute EKG and minimally elevated troponin, would recommend improvement in his respiratory status and extubation prior to a cardiac catheterization, for it does not seem emergent at this time.  2. Acute on Chronic Systolic CHF/ Nonischemic Cardiomyopathy - echo in 2010 showed EF of 30%. Repeat echocardiogram is pending. - had a  cath in 2010 which he reports was "normal". - would resume BB and ARB once BB normalizes.  3. New Onset Atrial Fibrillation - This patients CHA2DS2-VASc Score and unadjusted Ischemic Stroke Rate (% per year) is equal to 4.8 % stroke rate/year from a score of 4 (CHF, HTN, DM, Age). First episode only lasting less than 1 day. Is currently on Heparin. Will need to address the need for long-term anticoagulation prior to discharge. - would resume BB for rate-control.  4. HTN - BP has been 85/61 - 149/114 while admitted.  - was on Losartan 163m daily , HCTZ  15m daily, and COreg 6.250mBID.  - currently on hold in setting of soft BP's.  5. HLD - AST elevated, likely secondary to alcohol use.  - was on Simvastatin 2047maily PTA. Obtaining Lipid Panel.   6. Acute hypoxic and hypercapnic respiratory failure - currently intubated. - CXR showed bibasilar opacities and he has been started on Unasyn. - per admitting team  7. Alcohol Abuse - reports consuming 2/shots per day. Patient's wife says it is significantly more.   SigArna MediciA-C 06/20/2015, 9:49 AM Pager: [phone] have personally seen and examined this patient with BriBernerd PhoA-C. I agree with the assessment and plan as outlined above. He is admitted with respiratory failure. PCCM is following. Possible aspiration vs pulmonary edema. He has by report had no exertional chest pressure at home. Cardiac cath 7 years ago with no CAD per report. His troponin shows subtle elevation (0.12). EKG without ischemic changes. His presentation does not appear to be due to ACS. He is known to have a non-ischemic cardiomyopathy with ongoing etoh abuse. Nursing concerned about possible early withdrawal. My exam shows a very alert male, intubated. RRR. No murmurs. Lungs clear. No LE edema.I would favor that his respiratory status be improved before we consider elective cardiac cath. Would proceed with plans for weaning vent  and treating withdrawal from etoh. He also has new onset atrial fib. Will follow along. OK to continue heparin drip for now. Will need consideration of NOAC before discharge.   Frimet Durfee 06/20/2015 10:21 AM

## 2015-06-20 NOTE — Progress Notes (Signed)
[facility] ADULT ICU REPLACEMENT PROTOCOL FOR AM LAB REPLACEMENT ONLY  The patient does apply for the [facility] Adult ICU Electrolyte Replacment Protocol based on the criteria listed below:   1. Is GFR >/= 40 ml/min? Yes.    Patient's GFR today is >60 2. Is urine output >/= 0.5 ml/kg/hr for the last 6 hours? Yes.   Patient's UOP is 0.62 ml/kg/hr 3. Is BUN < 60 mg/dL? Yes.    Patient's BUN today is 15 4. Abnormal electrolyte  K 3.1 5. Ordered repletion with: per protocol 6. If a panic level lab has been reported, has the CCM MD in charge been notified? Yes.  .   Physician:  Illene Labrador, Canary Brim 06/20/2015 5:34 AM

## 2015-06-20 NOTE — Progress Notes (Signed)
ANTICOAGULATION CONSULT NOTE - Follow Up Consult  Pharmacy Consult for Heparin  Indication: chest pain/ACS, afib  No Known Allergies  Patient Measurements: Height: 6\' 2"  (188 cm) (Verbal by Patient) Weight: 235 lb (106.595 kg) IBW/kg (Calculated) : 82.2  Vital Signs: Temp: 99 F (37.2 C) (03/28 2000) Temp Source: Oral (03/28 2000) BP: 117/78 mmHg (03/28 2311) Pulse Rate: 60 (03/28 2311)  Labs:  Recent Labs  06/19/15 0345 06/19/15 0930 06/19/15 0936 06/19/15 1200 06/19/15 2149  HGB 16.3  --   --   --   --   HCT 51.0  --   --   --   --   PLT 219  --   --   --   --   APTT 28  --   --   --   --   LABPROT 14.3  --   --   --   --   INR 1.09  --   --   --   --   HEPARINUNFRC  --  0.70  --   --  0.52  CREATININE 1.49*  --  1.44*  --   --   TROPONINI 0.03  --  0.09* 0.12*  --     Estimated Creatinine Clearance: 63 mL/min (by C-G formula based on Cr of 1.44).   Assessment: Heparin level therapeutic x 2, troponin mildly elevated  Goal of Therapy:  Heparin level 0.3-0.7 units/ml Monitor platelets by anticoagulation protocol: Yes   Plan:  -Cont heparin at 1300 units/hr -Daily CBC/HL -Monitor for bleeding -F/U cardiac work-up  Narda Bonds 06/20/2015,12:24 AM

## 2015-06-20 NOTE — Progress Notes (Signed)
Pt was on 200 mcg IV fentanyl at 0700. GCS 15. Turned in 1/2 then off to wean and plan to extubate.  With in 20 min pt pale, hR 110, diaphoretic with beads on forehead. Anxiety level 11 on CIWA scale.  Pt writing on board concerns of "frustration and anxiety."  1mg  IV versed given and precedex ordered.  Vent put back on full support to consult cardiology. precedex  Started at .4.  Fentanyl at 94mcg. HR 56-60. EKG done. Will continue to monitor. Cardiology at bedside to assess. CIWA scale currently =1 C Melek Pownall RN

## 2015-06-20 NOTE — Progress Notes (Addendum)
PULMONARY / CRITICAL CARE MEDICINE   Name: Michael Roberts MRN: [id number] DOB: [birth year]    ADMISSION DATE:  06/19/2015 CONSULTATION DATE:  06/19/15  REFERRING MD:  EDP  CHIEF COMPLAINT:  SOB   SUBJECTIVE:  No issues overnight. Comfortable.   VITAL SIGNS: BP 126/82 mmHg  Pulse 60  Temp(Src) 98.6 F (37 C) (Oral)  Resp 20  Ht 6\' 2"  (1.88 m)  Wt 235 lb 0.2 oz (106.6 kg)  BMI 30.16 kg/m2  SpO2 100%  HEMODYNAMICS:    VENTILATOR SETTINGS: Vent Mode:  [-] PRVC FiO2 (%):  [40 %-70 %] 40 % Set Rate:  [20 bmp] 20 bmp Vt Set:  [id number] mL] 660 mL PEEP:  [5 cmH20] 5 cmH20 Plateau Pressure:  [18 cmH20-24 cmH20] 21 cmH20  INTAKE / OUTPUT: I/O last 3 completed shifts: In: 1441.9 [I.V.:991.9; IV Piggyback:450] Out: [id number] [Urine:1765]   PHYSICAL EXAMINATION: General: Adult male, in NAD. Neuro: Awake and agitated despite sedation. HEENT: [place]/AT. PERRL, sclerae anicteric. Cardiovascular: IRIR, no M/R/G.  Lungs: Respirations even and unlabored.  Coarse crackles bilaterally. Abdomen: BS x 4, soft, NT/ND.  Musculoskeletal: No gross deformities, no edema.  Skin: Intact, warm, no rashes.  LABS:  BMET  Recent Labs Lab 06/19/15 0345 06/19/15 0936 06/20/15 0420  NA 137 141 141  K 5.0 4.5 3.1*  CL 104 105 104  CO2 14* 20* 24  BUN 15 15 15   CREATININE 1.49* 1.44* 1.03  GLUCOSE 344* 125* 126*    Electrolytes  Recent Labs Lab 06/19/15 0345 06/19/15 0936 06/20/15 0420  CALCIUM 8.9 8.9 8.6*  MG  --  2.0 1.9  PHOS  --  6.3* 2.7    CBC  Recent Labs Lab 06/19/15 0345 06/20/15 0420  WBC 9.9 7.6  HGB 16.3 14.4  HCT 51.0 42.9  PLT 219 198    Coag's  Recent Labs Lab 06/19/15 0345  APTT 28  INR 1.09    Sepsis Markers  Recent Labs Lab 06/19/15 0403 06/19/15 0620 06/19/15 0930  LATICACIDVEN 10.50* 2.9* 2.2*  PROCALCITON  --  <0.10  --     ABG  Recent Labs Lab 06/19/15 0547 06/19/15 0940 06/20/15 0345  PHART 7.197* 7.452* 7.534*  PCO2ART  60.4* 33.9* 28.8*  PO2ART 90.0 159* 103*    Liver Enzymes  Recent Labs Lab 06/19/15 0345  AST 137*  ALT 43  ALKPHOS 75  BILITOT 1.2  ALBUMIN 3.8    Cardiac Enzymes  Recent Labs Lab 06/19/15 0345 06/19/15 0936 06/19/15 1200  TROPONINI 0.03 0.09* 0.12*    Glucose  Recent Labs Lab 06/19/15 0846 06/19/15 1228 06/19/15 1539 06/19/15 1930 06/19/15 2320 06/20/15 0329  GLUCAP 125* 113* 111* 120* 123* 127*    Imaging Dg Chest Port 1 View  06/20/2015  CLINICAL DATA:  Respiratory failure. EXAM: PORTABLE CHEST 1 VIEW COMPARISON:  06/19/2015. FINDINGS: Endotracheal tube and NG tube in good anatomic position. Mediastinum and hilar structures are normal. Persistent cardiomegaly. Improvement of pulmonary venous congestion . Interim partial clearing of bilateral pulmonary infiltrates/edema. Mild residual interstitial edema. Low lung volumes with mild bibasilar atelectasis. Small left pleural effusion cannot be excluded. No pneumothorax . IMPRESSION: 1. Endotracheal tube and NG tube noted in good anatomic position. 2. Stable cardiomegaly. Interim improvement pulmonary venous congestion bilateral pulmonary infiltrates/edema consistent with improving congestive heart failure. Mild residual interstitial edema. Right lung base subsegmental atelectasis. Electronically Signed   By: Marcello Moores  Register   On: 06/20/2015 07:17     STUDIES:  CXR 03/28 > vascular  congestion with possible RLL PNA. CXR 3/29 >> less pulm edema  CULTURES: Blood 03/28 > Sputum 03/28 >  ANTIBIOTICS: Zosyn 03/28 > 3/28 Unasyn 3/28 >>  SIGNIFICANT EVENTS: 03/28 > admitted with acute hypoxemic and hypercapnic respiratory failure requiring intubation.  LINES/TUBES: ETT 03/28 >  DISCUSSION: 70 y.o. M admitted 03/28 with acute hypoxemic and hypercapnic respiratory failure requiring intubation.  Concern for aspiration given his heavy ETOH use.  ASSESSMENT / PLAN:  PULMONARY A: Acute hypoxemic and hypercapnic  respiratory failure 2/2 flash pulm edema; concern for asp pna P:   Clinically better. Doing PST.  >> pt was diaphoretic on PST with frequent PVCs. Will abort PST and consult cards >> ? Need for LHC while intubated.  Full vent support. Wean as able. VAP prevention measures. Abx per ID section.   CARDIOVASCULAR A:  A.fib - ? New onset; no hx noted in system. CHF EF 25-30%, diffuse hypokinesis Chest pain - initial troponin neg and EKG without obvious ischemic changes.  P:  Continue heparin gtt. Will consult cards >> ? LHC  RENAL A:   AKI. Better.  P:   Correct K  GASTROINTESTINAL A:   Elevated AST - due to ETOH use. GI prophylaxis. Nutrition. P:   SUP: Pantoprazole. Start TF if not extubated.  CIWA protocol ETOH cessation counseling once extubated.  HEMATOLOGIC A:   VTE Prophylaxis. P:  SCD's / heparin gtt. CBC in AM.  INFECTIOUS A:   Concern for aspiration PNA. P:   On Unasyn >> will consider to d/c in am if cultures remain (-)  ENDOCRINE A:   DM. P:   SSI. Assess TSH.  NEUROLOGIC A:   Acute encephalopathy - due to hypercarbia + sedation. Hx ETOH use. P:   Sedation:  Fentanyl gtt / Midazolam PRN. Start precedex during PST >> getting agitated CIWA protocol  RASS goal: 0 to -1. Daily WUA. Thiamine / Folate.   Family updated:wife updated.   Interdisciplinary Family Meeting v Palliative Care Meeting:  Due by: 04/03.  CC time: I spent 35 minutes of critical care time today.    Monica Becton, MD 06/20/2015, 7:48 AM [place] [facility] Pager [phone] After 3 pm or if no answer, call [phone]

## 2015-06-21 DIAGNOSIS — I5023 Acute on chronic systolic (congestive) heart failure: Secondary | ICD-10-CM

## 2015-06-21 DIAGNOSIS — I480 Paroxysmal atrial fibrillation: Secondary | ICD-10-CM

## 2015-06-21 DIAGNOSIS — R7989 Other specified abnormal findings of blood chemistry: Secondary | ICD-10-CM

## 2015-06-21 LAB — CULTURE, RESPIRATORY W GRAM STAIN: Culture: NORMAL

## 2015-06-21 LAB — BASIC METABOLIC PANEL
ANION GAP: 11 (ref 5–15)
BUN: 16 mg/dL (ref 6–20)
CHLORIDE: 108 mmol/L (ref 101–111)
CO2: 22 mmol/L (ref 22–32)
Calcium: 8.6 mg/dL — ABNORMAL LOW (ref 8.9–10.3)
Creatinine, Ser: 0.9 mg/dL (ref 0.61–1.24)
GFR calc non Af Amer: >60 mL/min (ref >60)
Glucose, Bld: 141 mg/dL — ABNORMAL HIGH (ref 65–99)
Potassium: 3.3 mmol/L — ABNORMAL LOW (ref 3.5–5.1)
Sodium: 141 mmol/L (ref 135–145)

## 2015-06-21 LAB — CBC
HEMATOCRIT: 41.1 % (ref 39.0–52.0)
HEMOGLOBIN: 13.4 g/dL (ref 13.0–17.0)
MCH: 30.5 pg (ref 26.0–34.0)
MCHC: 32.6 g/dL (ref 30.0–36.0)
MCV: 93.4 fL (ref 78.0–100.0)
Platelets: 197 10*3/uL (ref 150–400)
RBC: 4.4 MIL/uL (ref 4.22–5.81)
RDW: 12.8 % (ref 11.5–15.5)
WBC: 6.9 10*3/uL (ref 4.0–10.5)

## 2015-06-21 LAB — GLUCOSE, CAPILLARY
GLUCOSE-CAPILLARY: 109 mg/dL — AB (ref 65–99)
GLUCOSE-CAPILLARY: 130 mg/dL — AB (ref 65–99)
GLUCOSE-CAPILLARY: 155 mg/dL — AB (ref 65–99)
GLUCOSE-CAPILLARY: 176 mg/dL — AB (ref 65–99)
Glucose-Capillary: 124 mg/dL — ABNORMAL HIGH (ref 65–99)
Glucose-Capillary: 125 mg/dL — ABNORMAL HIGH (ref 65–99)
Glucose-Capillary: 164 mg/dL — ABNORMAL HIGH (ref 65–99)

## 2015-06-21 LAB — LIPID PANEL
CHOL/HDL RATIO: 3.5 ratio
CHOLESTEROL: 150 mg/dL (ref 0–200)
HDL: 43 mg/dL (ref >40)
LDL CALC: 75 mg/dL (ref 0–99)
TRIGLYCERIDES: 160 mg/dL — AB (ref <150)
VLDL: 32 mg/dL (ref 0–40)

## 2015-06-21 LAB — CULTURE, RESPIRATORY: SPECIAL REQUESTS: NORMAL

## 2015-06-21 LAB — POTASSIUM: POTASSIUM: 4.4 mmol/L (ref 3.5–5.1)

## 2015-06-21 LAB — PHOSPHORUS: Phosphorus: 2.4 mg/dL — ABNORMAL LOW (ref 2.5–4.6)

## 2015-06-21 LAB — MAGNESIUM: Magnesium: 2 mg/dL (ref 1.7–2.4)

## 2015-06-21 MED ORDER — POTASSIUM CHLORIDE 10 MEQ/100ML IV SOLN
10.0000 meq | INTRAVENOUS | Status: AC
  Administered 2015-06-21 (×5): 10 meq via INTRAVENOUS
  Filled 2015-06-21 (×5): qty 100

## 2015-06-21 MED ORDER — HYDROCHLOROTHIAZIDE 25 MG PO TABS
25.0000 mg | ORAL_TABLET | Freq: Every day | ORAL | Status: DC
  Administered 2015-06-22 – 2015-06-24 (×3): 25 mg via ORAL
  Filled 2015-06-21 (×4): qty 1

## 2015-06-21 MED ORDER — CARVEDILOL 6.25 MG PO TABS
6.2500 mg | ORAL_TABLET | Freq: Two times a day (BID) | ORAL | Status: DC
  Administered 2015-06-22 – 2015-06-25 (×7): 6.25 mg via ORAL
  Filled 2015-06-21 (×8): qty 1

## 2015-06-21 MED ORDER — CETYLPYRIDINIUM CHLORIDE 0.05 % MT LIQD
7.0000 mL | Freq: Two times a day (BID) | OROMUCOSAL | Status: DC
Start: 2015-06-21 — End: 2015-06-22

## 2015-06-21 MED ORDER — POTASSIUM CHLORIDE 10 MEQ/50ML IV SOLN: 10.0000 meq | INTRAVENOUS | Status: DC

## 2015-06-21 MED ORDER — DEXTROSE 5 % IV SOLN
20.0000 mmol | Freq: Once | INTRAVENOUS | Status: AC
  Administered 2015-06-21: 20 mmol via INTRAVENOUS
  Filled 2015-06-21: qty 6.67

## 2015-06-21 NOTE — Progress Notes (Signed)
Pt refuse NIV for the night. Pt is satting 100% at this time on a 4LNC. Pt stated he feels fine and comfortable. Stating that wearing the mask he feels he is going backwards. Pt follows commands and is alert and oriented, pt refuse NIV mask and he is resting comfortably at this time. RT will continue to monitor

## 2015-06-21 NOTE — Progress Notes (Signed)
PULMONARY / CRITICAL CARE MEDICINE   Name: Michael Roberts MRN: [id number] DOB: 1945-11-18    ADMISSION DATE:  06/19/2015 CONSULTATION DATE:  06/19/15  REFERRING MD:  EDP  CHIEF COMPLAINT:  SOB   SUBJECTIVE:  No issues overnight. Some agitation. Did fair on PST 3/29. Doing PST now >> comfortable  VITAL SIGNS: BP 123/81 mmHg  Pulse 63  Temp(Src) 98.4 F (36.9 C) (Axillary)  Resp 20  Ht 6\' 2"  (1.88 m)  Wt 234 lb 9.1 oz (106.4 kg)  BMI 30.10 kg/m2  SpO2 100%  HEMODYNAMICS:    VENTILATOR SETTINGS: Vent Mode:  [-] PRVC FiO2 (%):  [40 %] 40 % Set Rate:  [20 bmp] 20 bmp Vt Set:  [id number] mL] 660 mL PEEP:  [5 cmH20] 5 cmH20 Plateau Pressure:  [11 cmH20-20 cmH20] 16 cmH20  INTAKE / OUTPUT: I/O last 3 completed shifts: In: 2352 [I.V.:967; NG/GT:635; IV Piggyback:750] Out: [id number] [Urine:1750]   PHYSICAL EXAMINATION: General: Adult male, in NAD. Neuro: Awake and agitated despite sedation. HEENT: [place]/AT. PERRL, sclerae anicteric. Cardiovascular: IRIR, no M/R/G.  Lungs: Respirations even and unlabored.  Coarse crackles bilaterally >> less today Abdomen: BS x 4, soft, NT/ND.  Musculoskeletal: No gross deformities, no edema.  Skin: Intact, warm, no rashes.  LABS:  BMET  Recent Labs Lab 06/19/15 0936 06/20/15 0420 06/20/15 1046 06/21/15 0345  NA 141 141  --  141  K 4.5 3.1* 3.6 3.3*  CL 105 104  --  108  CO2 20* 24  --  22  BUN 15 15  --  16  CREATININE 1.44* 1.03  --  0.90  GLUCOSE 125* 126*  --  141*    Electrolytes  Recent Labs Lab 06/19/15 0936 06/20/15 0420 06/21/15 0345  CALCIUM 8.9 8.6* 8.6*  MG 2.0 1.9 2.0  PHOS 6.3* 2.7 2.4*    CBC  Recent Labs Lab 06/19/15 0345 06/20/15 0420 06/21/15 0345  WBC 9.9 7.6 6.9  HGB 16.3 14.4 13.4  HCT 51.0 42.9 41.1  PLT 219 198 197    Coag's  Recent Labs Lab 06/19/15 0345  APTT 28  INR 1.09    Sepsis Markers  Recent Labs Lab 06/19/15 0403 06/19/15 0620 06/19/15 0930  LATICACIDVEN 10.50*  2.9* 2.2*  PROCALCITON  --  <0.10  --     ABG  Recent Labs Lab 06/19/15 0547 06/19/15 0940 06/20/15 0345  PHART 7.197* 7.452* 7.534*  PCO2ART 60.4* 33.9* 28.8*  PO2ART 90.0 159* 103*    Liver Enzymes  Recent Labs Lab 06/19/15 0345  AST 137*  ALT 43  ALKPHOS 75  BILITOT 1.2  ALBUMIN 3.8    Cardiac Enzymes  Recent Labs Lab 06/19/15 0345 06/19/15 0936 06/19/15 1200  TROPONINI 0.03 0.09* 0.12*    Glucose  Recent Labs Lab 06/20/15 0729 06/20/15 1149 06/20/15 1548 06/20/15 2130 06/21/15 0038 06/21/15 0320  GLUCAP 130* 137* 133* 128* 164* 124*    Imaging No results found.   STUDIES:  CXR 03/28 > vascular congestion with possible RLL PNA. CXR 3/29 >> less pulm edema  CULTURES: Blood 03/28 > (-) Sputum 03/28 > (-)  ANTIBIOTICS: Zosyn 03/28 > 3/28 Unasyn 3/28 >>  SIGNIFICANT EVENTS: 03/28 > admitted with acute hypoxemic and hypercapnic respiratory failure requiring intubation.  LINES/TUBES: ETT 03/28 >  DISCUSSION: 70 y.o. M admitted 03/28 with acute hypoxemic and hypercapnic respiratory failure requiring intubation.  Concern for aspiration given his heavy ETOH use.  ASSESSMENT / PLAN:  PULMONARY A: Acute hypoxemic and hypercapnic respiratory  failure 2/2 flash pulm edema; concern for asp pna P:   Clinically better. Doing PST.  >> possible extubate today >> likely extubate to BipaP x 2 hrs then needs bipap at hs and prn Full vent support. Wean as able. VAP prevention measures. Abx per ID section.   CARDIOVASCULAR A:  A.fib - ? New onset; no hx noted in system. CHF EF 25-30%, diffuse hypokinesis Chest pain - initial troponin neg and EKG without obvious ischemic changes. Demand ischemia  P:  Appreciate cardiology input. Will start coreg and ARB hopefully today. BP and HR on low side 2/2 precedex. Will try to wean off precedex and start meds.  Heparin drip dcd 2/2 hematuria Will need a LHC  RENAL A:   AKI. Better.  P:    Correct K, electolytes Needs diuresis  GASTROINTESTINAL A:   Elevated AST - due to ETOH use. GI prophylaxis. Nutrition. P:   SUP: Pantoprazole. Cont TF.  CIWA protocol ETOH cessation counseling once extubated.  HEMATOLOGIC A:   VTE Prophylaxis. P:  SCD's  Heparin dc'd 2/2 hematuria CBC in AM.  INFECTIOUS A:   Concern for aspiration PNA. P:   On Unasyn >> D3 >> will d/c today. (-) cultures and likely just pulm edema.   ENDOCRINE A:   DM. P:   SSI. Assess TSH.  NEUROLOGIC A:   Acute encephalopathy - due to hypercarbia + sedation. Hx ETOH use. P:   Sedation:  Fentanyl gtt / Midazolam PRN. Cont  precedex drip CIWA protocol  RASS goal: 0 to -1. Daily WUA. Thiamine / Folate.   Family updated: No family at bedside  Interdisciplinary Family Meeting v Palliative Care Meeting:  Due by: 04/03.  CC time: I spent 36 minutes of critical care time today.    Monica Becton, MD 06/21/2015, 8:11 AM [facility] Pager [phone] After 3 pm or if no answer, call [phone]

## 2015-06-21 NOTE — Progress Notes (Signed)
Pt very agitated off precedex, MD aware patient is following commands and writing on board, Patient weaning on precedex comfortably.

## 2015-06-21 NOTE — Procedures (Signed)
Extubation Procedure Note  Patient Details:   Name: Michael Roberts DOB: [birth year] MRN: [id number]   Airway Documentation:  Airway 8 mm (Active)  Secured at (cm) 25 cm 06/21/2015  7:55 AM  Measured From Lips 06/21/2015  7:55 AM  Secured Location Right 06/21/2015  7:55 AM  Secured By [employer] 06/21/2015  7:55 AM  Tube Holder Repositioned Yes 06/21/2015  7:55 AM  Cuff Pressure (cm H2O) 26 cm H2O 06/20/2015  3:02 PM  Site Condition Dry 06/21/2015  7:55 AM    Evaluation  O2 sats: stable throughout Complications: No apparent complications Patient did tolerate procedure well. Bilateral Breath Sounds: Clear, Diminished   Yes   Pt. Was extubated to BIPAP per MD order. Pt. Was placed on BIPAP 10/5 with 40% FIO2 & is tolerating it well at this time. Pt. Was instructed on IS x 5, highest goal achieved was 1750 mL.  Claretta Fraise 06/21/2015, 9:38 AM

## 2015-06-21 NOTE — Progress Notes (Signed)
Patient extubated to Bipap, patient resting comfortably all vital signs WNL, sating 100%. Will continue to monitor closely.

## 2015-06-21 NOTE — Progress Notes (Signed)
SUBJECTIVE:awaken intubated. Denies pain.   Tele: nsr  BP 120/81 mmHg  Pulse 62  Temp(Src) 98.4 F (36.9 C) (Axillary)  Resp 20  Ht '6\' 2"'$  (1.88 m)  Wt 234 lb 9.1 oz (106.4 kg)  BMI 30.10 kg/m2  SpO2 100%  Intake/Output Summary (Last 24 hours) at 06/21/15 0648 Last data filed at 06/21/15 0539  Gross per 24 hour  Intake 1740.2 ml  Output   1125 ml  Net  615.2 ml    PHYSICAL EXAM General: awake, intubated.  Psych:  Good affect, responds appropriately Neck: No JVD. No masses noted.  Lungs: Clear bilaterally with no wheezes or rhonci noted.  Heart: RRR with no murmurs noted. Abdomen: Bowel sounds are present. Soft, non-tender.  Extremities: No lower extremity edema.   LABS: Basic Metabolic Panel:  Recent Labs  06/20/15 0420 06/20/15 1046 06/21/15 0345  NA 141  --  141  K 3.1* 3.6 3.3*  CL 104  --  108  CO2 24  --  22  GLUCOSE 126*  --  141*  BUN 15  --  16  CREATININE 1.03  --  0.90  CALCIUM 8.6*  --  8.6*  MG 1.9  --  2.0  PHOS 2.7  --  2.4*   CBC:  Recent Labs  06/19/15 0345 06/20/15 0420 06/21/15 0345  WBC 9.9 7.6 6.9  NEUTROABS 2.0  --   --   HGB 16.3 14.4 13.4  HCT 51.0 42.9 41.1  MCV 98.6 93.5 93.4  PLT 219 198 197   Cardiac Enzymes:  Recent Labs  06/19/15 0345 06/19/15 0936 06/19/15 1200  TROPONINI 0.03 0.09* 0.12*    Current Meds: . ampicillin-sulbactam (UNASYN) IV  3 g Intravenous Q6H  . antiseptic oral rinse  7 mL Mouth Rinse 10 times per day  . chlorhexidine gluconate (SAGE KIT)  15 mL Mouth Rinse BID  . feeding supplement (PRO-STAT SUGAR FREE 64)  60 mL Per Tube QID  . feeding supplement (VITAL HIGH PROTEIN)  1,000 mL Per Tube Q24H  . folic acid  1 mg Intravenous Daily  . Influenza vac split quadrivalent PF  0.5 mL Intramuscular Tomorrow-1000  . insulin aspart  0-15 Units Subcutaneous 6 times per day  . pantoprazole (PROTONIX) IV  40 mg Intravenous QHS  . potassium chloride  10 mEq Intravenous Q1 Hr x 5  . thiamine   100 mg Intravenous Daily   Echo 06/19/15: Left ventricle: The cavity size was normal. There was mild  concentric hypertrophy. Systolic function was moderately reduced.  The estimated ejection fraction was in the range of 25% to 30%.  Diffuse hypokinesis. Akinesis of the inferior and inferoseptal  myocardium. Doppler parameters are consistent with abnormal left  ventricular relaxation (grade 1 diastolic dysfunction). Doppler  parameters are consistent with high ventricular filling pressure. - Aortic valve: Transvalvular velocity was within the normal range.  There was no stenosis. There was mild regurgitation. - Aorta: Ascending aortic AP diameter: 3.9 mm. - Ascending aorta: The ascending aorta was mildly dilated. - Mitral valve: There was mild regurgitation. - Right ventricle: The cavity size was normal. Wall thickness was  normal. Systolic function was normal. - Tricuspid valve: Structurally normal valve. There was no  regurgitation. - Inferior vena cava: The vessel was normal in size. The  respirophasic diameter changes were in the normal range (>= 50%),  consistent with normal central venous pressure.   ASSESSMENT AND PLAN:  1. Chest Pain/Elevated troponin: Mild troponin elevation with  flat trend. Prior cath 7 years ago with no CAD. (no report available, this is per pt). He is on heparin. Given flash pulmonary edema with mildly elevated troponin, will plan cath once extubated, tentative tomorrow.   2. Acute on Chronic Systolic CHF/ Nonischemic Cardiomyopathy: Echo with LVEF=25-30%. This is unchanged from 2010. Would resume BB and ARB.   3. New Onset Atrial Fibrillation: This patients CHA2DS2-VASc Score and unadjusted Ischemic Stroke Rate (% per year) is equal to 4.8 % stroke rate/year from a score of 4 (CHF, HTN, DM, Age). First episode only lasting less than 1 day. He was on heparin but with hematuria last night, this was stopped. Will need to consider NOAC before  discharge. Would resume beta blocker for rate-control.  4. HTN: BP is improved today. He was on Losartan '100mg'$  daily , HCTZ '25mg'$  daily, and Coreg 6.'25mg'$  BID. Would resume ARB and beta blocker.   5. Acute hypoxic respiratory failure: He is intubated. He has been on Unasyn. Hopefully can be extubated today.   Aquanetta Schwarz  3/30/20176:48 AM

## 2015-06-22 ENCOUNTER — Encounter (HOSPITAL_COMMUNITY)
Admission: EM | Disposition: A | Discharge status: Home / Self Care | Payer: Self-pay | Source: Home / Self Care | Attending: Pulmonary Disease

## 2015-06-22 HISTORY — PX: CARDIAC CATHETERIZATION: SHX172

## 2015-06-22 LAB — CBC
HEMATOCRIT: 40.9 % (ref 39.0–52.0)
HEMOGLOBIN: 13.1 g/dL (ref 13.0–17.0)
MCH: 30.8 pg (ref 26.0–34.0)
MCHC: 32 g/dL (ref 30.0–36.0)
MCV: 96 fL (ref 78.0–100.0)
Platelets: 177 10*3/uL (ref 150–400)
RBC: 4.26 MIL/uL (ref 4.22–5.81)
RDW: 12.9 % (ref 11.5–15.5)
WBC: 3.9 10*3/uL — AB (ref 4.0–10.5)

## 2015-06-22 LAB — BASIC METABOLIC PANEL
ANION GAP: 9 (ref 5–15)
BUN: 22 mg/dL — AB (ref 6–20)
CHLORIDE: 108 mmol/L (ref 101–111)
CO2: 23 mmol/L (ref 22–32)
Calcium: 8.7 mg/dL — ABNORMAL LOW (ref 8.9–10.3)
Creatinine, Ser: 0.98 mg/dL (ref 0.61–1.24)
GFR calc Af Amer: >60 mL/min (ref >60)
GFR calc non Af Amer: >60 mL/min (ref >60)
GLUCOSE: 128 mg/dL — AB (ref 65–99)
POTASSIUM: 3.6 mmol/L (ref 3.5–5.1)
Sodium: 140 mmol/L (ref 135–145)

## 2015-06-22 LAB — MAGNESIUM: Magnesium: 2 mg/dL (ref 1.7–2.4)

## 2015-06-22 LAB — GLUCOSE, CAPILLARY
GLUCOSE-CAPILLARY: 104 mg/dL — AB (ref 65–99)
GLUCOSE-CAPILLARY: 122 mg/dL — AB (ref 65–99)
GLUCOSE-CAPILLARY: 85 mg/dL (ref 65–99)
GLUCOSE-CAPILLARY: 98 mg/dL (ref 65–99)
Glucose-Capillary: 131 mg/dL — ABNORMAL HIGH (ref 65–99)
Glucose-Capillary: 81 mg/dL (ref 65–99)

## 2015-06-22 LAB — PHOSPHORUS: Phosphorus: 3.3 mg/dL (ref 2.5–4.6)

## 2015-06-22 SURGERY — LEFT HEART CATH AND CORONARY ANGIOGRAPHY
Anesthesia: LOCAL

## 2015-06-22 MED ORDER — VERAPAMIL HCL 2.5 MG/ML IV SOLN
INTRAVENOUS | Status: AC
  Filled 2015-06-22: qty 2

## 2015-06-22 MED ORDER — SODIUM CHLORIDE 0.9% FLUSH
3.0000 mL | Freq: Two times a day (BID) | INTRAVENOUS | Status: DC
  Administered 2015-06-22: 3 mL via INTRAVENOUS

## 2015-06-22 MED ORDER — MIDAZOLAM HCL 2 MG/2ML IJ SOLN
INTRAMUSCULAR | Status: AC
  Filled 2015-06-22: qty 2

## 2015-06-22 MED ORDER — SODIUM CHLORIDE 0.9 % IV SOLN: 250.0000 mL | INTRAVENOUS | Status: DC | PRN

## 2015-06-22 MED ORDER — SODIUM CHLORIDE 0.9 % IV SOLN
INTRAVENOUS | Status: DC
  Administered 2015-06-22: 08:00:00 via INTRAVENOUS

## 2015-06-22 MED ORDER — HEPARIN (PORCINE) IN NACL 2-0.9 UNIT/ML-% IJ SOLN
INTRAMUSCULAR | Status: AC
  Filled 2015-06-22: qty 1000

## 2015-06-22 MED ORDER — SODIUM CHLORIDE 0.9% FLUSH
3.0000 mL | Freq: Two times a day (BID) | INTRAVENOUS | Status: DC
  Administered 2015-06-22 – 2015-06-23 (×2): 3 mL via INTRAVENOUS

## 2015-06-22 MED ORDER — HEPARIN (PORCINE) IN NACL 2-0.9 UNIT/ML-% IJ SOLN
INTRAMUSCULAR | Status: DC | PRN
  Administered 2015-06-22: 1000 mL

## 2015-06-22 MED ORDER — SODIUM CHLORIDE 0.9 % IV SOLN: INTRAVENOUS | Status: DC

## 2015-06-22 MED ORDER — IOPAMIDOL (ISOVUE-370) INJECTION 76%
INTRAVENOUS | Status: AC
  Filled 2015-06-22: qty 50

## 2015-06-22 MED ORDER — SODIUM CHLORIDE 0.9% FLUSH: 3.0000 mL | INTRAVENOUS | Status: DC | PRN

## 2015-06-22 MED ORDER — LIDOCAINE HCL (PF) 1 % IJ SOLN
INTRAMUSCULAR | Status: DC | PRN
  Administered 2015-06-22: 20 mL
  Administered 2015-06-22: 2 mL

## 2015-06-22 MED ORDER — FENTANYL CITRATE (PF) 100 MCG/2ML IJ SOLN
INTRAMUSCULAR | Status: AC
  Filled 2015-06-22: qty 2

## 2015-06-22 MED ORDER — IOPAMIDOL (ISOVUE-370) INJECTION 76%
INTRAVENOUS | Status: DC | PRN
  Administered 2015-06-22: 160 mL via INTRA_ARTERIAL

## 2015-06-22 MED ORDER — FENTANYL CITRATE (PF) 100 MCG/2ML IJ SOLN
INTRAMUSCULAR | Status: DC | PRN
  Administered 2015-06-22: 25 ug via INTRAVENOUS

## 2015-06-22 MED ORDER — ASPIRIN 81 MG PO CHEW
81.0000 mg | CHEWABLE_TABLET | ORAL | Status: AC
  Administered 2015-06-22: 81 mg via ORAL
  Filled 2015-06-22: qty 1

## 2015-06-22 MED ORDER — LOSARTAN POTASSIUM 25 MG PO TABS
25.0000 mg | ORAL_TABLET | Freq: Every day | ORAL | Status: DC
  Administered 2015-06-22 – 2015-06-23 (×2): 25 mg via ORAL
  Filled 2015-06-22 (×2): qty 1

## 2015-06-22 MED ORDER — IOPAMIDOL (ISOVUE-370) INJECTION 76%
INTRAVENOUS | Status: AC
  Filled 2015-06-22: qty 100

## 2015-06-22 MED ORDER — CETYLPYRIDINIUM CHLORIDE 0.05 % MT LIQD: 7.0000 mL | Freq: Two times a day (BID) | OROMUCOSAL | Status: DC

## 2015-06-22 MED ORDER — LIDOCAINE HCL (PF) 1 % IJ SOLN
INTRAMUSCULAR | Status: AC
  Filled 2015-06-22: qty 30

## 2015-06-22 MED ORDER — CETYLPYRIDINIUM CHLORIDE 0.05 % MT LIQD
7.0000 mL | Freq: Two times a day (BID) | OROMUCOSAL | Status: DC
  Administered 2015-06-22 – 2015-06-25 (×5): 7 mL via OROMUCOSAL

## 2015-06-22 MED ORDER — SODIUM CHLORIDE 0.9 % IV SOLN: INTRAVENOUS | Status: AC

## 2015-06-22 MED ORDER — MIDAZOLAM HCL 2 MG/2ML IJ SOLN
INTRAMUSCULAR | Status: DC | PRN
Start: 2015-06-22 — End: 2015-06-22
  Administered 2015-06-22: 1 mg via INTRAVENOUS

## 2015-06-22 MED ORDER — MORPHINE SULFATE (PF) 2 MG/ML IV SOLN
2.0000 mg | Freq: Once | INTRAVENOUS | Status: AC
  Administered 2015-06-23: 2 mg via INTRAVENOUS
  Filled 2015-06-22: qty 1

## 2015-06-22 SURGICAL SUPPLY — 14 items
CATH INFINITI 5 FR AR1 MOD (CATHETERS) ×2 IMPLANT
CATH INFINITI 5FR ANG PIGTAIL (CATHETERS) ×4 IMPLANT
CATH INFINITI 5FR JL4 (CATHETERS) ×2 IMPLANT
CATH INFINITI 5FR JR4 125CM (CATHETERS) ×2 IMPLANT
CATH INFINITI JR4 5F (CATHETERS) ×2 IMPLANT
DEVICE RAD COMP TR BAND LRG (VASCULAR PRODUCTS) ×2 IMPLANT
GLIDESHEATH SLEND SS 6F .021 (SHEATH) ×2 IMPLANT
KIT HEART LEFT (KITS) ×2 IMPLANT
PACK CARDIAC CATHETERIZATION (CUSTOM PROCEDURE TRAY) ×2 IMPLANT
SHEATH PINNACLE 5F 10CM (SHEATH) ×2 IMPLANT
SYR MEDRAD MARK V 150ML (SYRINGE) ×2 IMPLANT
TRANSDUCER W/STOPCOCK (MISCELLANEOUS) ×2 IMPLANT
TUBING CIL FLEX 10 FLL-RA (TUBING) ×2 IMPLANT
WIRE J 3MM .035X260CM (WIRE) ×2 IMPLANT

## 2015-06-22 NOTE — Progress Notes (Signed)
Pt complaint of increasing congestion in lungs. Sounds increasingly diminished and full. Placed on 1L [place] because pt refused BiPap. Pt states, "I feel like I did before I came into the hospital. I'm afraid that if I go to sleep I will not be able to breath and won't wake back up." Other complaints include HA and persistent cough. After 15 min on [place], pt states HA and cough have resolved. Contacted Dr. Jimmy Footman regarding potential intervention for congestion. Awaiting response. Pt appears more relaxed, but still anxious.

## 2015-06-22 NOTE — Progress Notes (Signed)
Site area: Right  Groin a 5 french arterial sheath was removed  Site Prior to Removal:  Level 0  Pressure Applied For 20 MINUTES    Bedrest Beginning at 1550p  Manual:   Yes.    Patient Status During Pull:  stable  Post Pull Groin Site:  Level 0  Post Pull Instructions Given:  Yes.    Post Pull Pulses Present:  Yes.    Dressing Applied:  Yes.    Comments:  VS remain stable during sheath pull.

## 2015-06-22 NOTE — Progress Notes (Signed)
     SUBJECTIVE: No chest pain this am. Minimal dyspnea.   Tele: sinus  BP 102/76 mmHg  Pulse 63  Temp(Src) 98.6 F (37 C) (Oral)  Resp 17  Ht 6\' 2"  (1.88 m)  Wt 231 lb 0.7 oz (104.8 kg)  BMI 29.65 kg/m2  SpO2 100%  Intake/Output Summary (Last 24 hours) at 06/22/15 0743 Last data filed at 06/22/15 0741  Gross per 24 hour  Intake 1152.56 ml  Output    790 ml  Net 362.56 ml    PHYSICAL EXAM General: Well developed, well nourished, in no acute distress. Alert and oriented x 3.  Psych:  Good affect, responds appropriately Neck: No JVD. No masses noted.  Lungs: Clear bilaterally with no wheezes or rhonci noted.  Heart: RRR with no murmurs noted. Abdomen: Bowel sounds are present. Soft, non-tender.  Extremities: No lower extremity edema.   LABS: Basic Metabolic Panel:  Recent Labs  06/21/15 0345 06/21/15 1445 06/22/15 0430  NA 141  --  140  K 3.3* 4.4 3.6  CL 108  --  108  CO2 22  --  23  GLUCOSE 141*  --  128*  BUN 16  --  22*  CREATININE 0.90  --  0.98  CALCIUM 8.6*  --  8.7*  MG 2.0  --  2.0  PHOS 2.4*  --  3.3   CBC:  Recent Labs  06/21/15 0345 06/22/15 0430  WBC 6.9 3.9*  HGB 13.4 13.1  HCT 41.1 40.9  MCV 93.4 96.0  PLT 197 177   Cardiac Enzymes:  Recent Labs  06/19/15 0936 06/19/15 1200  TROPONINI 0.09* 0.12*   Fasting Lipid Panel:  Recent Labs  06/21/15 0350  CHOL 150  HDL 43  LDLCALC 75  TRIG 160*  CHOLHDL 3.5    Current Meds: . antiseptic oral rinse  7 mL Mouth Rinse BID  . carvedilol  6.25 mg Oral BID WC  . folic acid  1 mg Intravenous Daily  . hydrochlorothiazide  25 mg Oral Daily  . insulin aspart  0-15 Units Subcutaneous 6 times per day  . pantoprazole (PROTONIX) IV  40 mg Intravenous QHS  . thiamine  100 mg Intravenous Daily     ASSESSMENT AND PLAN:  1. Chest Pain/Elevated troponin: Mild troponin elevation with flat trend. Prior cath 7 years ago with no CAD. (no report available, this is per pt). Given flash  pulmonary edema with mildly elevated troponin, will plan cath later today.   2. Acute on Chronic Systolic CHF/ Nonischemic Cardiomyopathy: Echo with LVEF=25-30%. This is unchanged from 2010. He is back on his beta blocker. Would resume ARB as BP tolerates.   3. New Onset Atrial Fibrillation: This patients CHA2DS2-VASc Score and unadjusted Ischemic Stroke Rate (% per year) is equal to 4.8 % stroke rate/year from a score of 4 (CHF, HTN, DM, Age). First episode only lasting less than 1 day. He was on heparin but with hematuria this was stopped. Will need to consider NOAC before discharge. Would continue beta blocker for rate-control.  4. HTN: BP is stable today.   5. Acute hypoxic respiratory failure: He is now extubated.    I have seen him today and specifically addressed multiple issues including atrial fib, CHF and his cardiomyopathy. Cath later today given flash pulmonary edema and elevated troponin.   MCALHANY,CHRISTOPHER  3/31/20177:43 AM

## 2015-06-22 NOTE — Progress Notes (Signed)
150 mL of 250 mL fentanyl gtt wasted in sink by two RNs Lexi P and Theresa O.

## 2015-06-22 NOTE — Progress Notes (Signed)
Pt refuse NIV for the night. Pt is stable at this time 100% sat on 2L [place]. No distress or complications noted.

## 2015-06-22 NOTE — Progress Notes (Signed)
eLink Physician-Brief Progress Note Patient Name: Michael Roberts DOB: [birth year] MRN: [id number]   Date of Service  06/22/2015  HPI/Events of Note  Patient expressing sense of impending doom to nurse.  C/O of resp distress.  On 2 L [place] with sats of 100% and RR of 24.  Had PE on PCXR earlier and was intubated.  Now extubated.  HD stable  eICU Interventions  Plan: Stat PCXR to evaluate for pulm edema 2 mg morphine IV times one Continue to monitor patient     Intervention Category Intermediate Interventions: Respiratory distress - evaluation and management  DETERDING,ELIZABETH 06/22/2015, 11:56 PM

## 2015-06-22 NOTE — Interval H&P Note (Signed)
History and Physical Interval Note:  06/22/2015 1:43 PM  Michael Roberts  has presented today for cardiac cath with the diagnosis of CHF. The various methods of treatment have been discussed with the patient and family. After consideration of risks, benefits and other options for treatment, the patient has consented to  Procedure(s): Left Heart Cath and Coronary Angiography (N/A) as a surgical intervention .  The patient's history has been reviewed, patient examined, no change in status, stable for surgery.  I have reviewed the patient's chart and labs.  Questions were answered to the patient's satisfaction.    MCALHANY,CHRISTOPHER

## 2015-06-22 NOTE — H&P (View-Only) (Signed)
     SUBJECTIVE: No chest pain this am. Minimal dyspnea.   Tele: sinus  BP 102/76 mmHg  Pulse 63  Temp(Src) 98.6 F (37 C) (Oral)  Resp 17  Ht 6\' 2"  (1.88 m)  Wt 231 lb 0.7 oz (104.8 kg)  BMI 29.65 kg/m2  SpO2 100%  Intake/Output Summary (Last 24 hours) at 06/22/15 0743 Last data filed at 06/22/15 0741  Gross per 24 hour  Intake 1152.56 ml  Output    790 ml  Net 362.56 ml    PHYSICAL EXAM General: Well developed, well nourished, in no acute distress. Alert and oriented x 3.  Psych:  Good affect, responds appropriately Neck: No JVD. No masses noted.  Lungs: Clear bilaterally with no wheezes or rhonci noted.  Heart: RRR with no murmurs noted. Abdomen: Bowel sounds are present. Soft, non-tender.  Extremities: No lower extremity edema.   LABS: Basic Metabolic Panel:  Recent Labs  06/21/15 0345 06/21/15 1445 06/22/15 0430  NA 141  --  140  K 3.3* 4.4 3.6  CL 108  --  108  CO2 22  --  23  GLUCOSE 141*  --  128*  BUN 16  --  22*  CREATININE 0.90  --  0.98  CALCIUM 8.6*  --  8.7*  MG 2.0  --  2.0  PHOS 2.4*  --  3.3   CBC:  Recent Labs  06/21/15 0345 06/22/15 0430  WBC 6.9 3.9*  HGB 13.4 13.1  HCT 41.1 40.9  MCV 93.4 96.0  PLT 197 177   Cardiac Enzymes:  Recent Labs  06/19/15 0936 06/19/15 1200  TROPONINI 0.09* 0.12*   Fasting Lipid Panel:  Recent Labs  06/21/15 0350  CHOL 150  HDL 43  LDLCALC 75  TRIG 160*  CHOLHDL 3.5    Current Meds: . antiseptic oral rinse  7 mL Mouth Rinse BID  . carvedilol  6.25 mg Oral BID WC  . folic acid  1 mg Intravenous Daily  . hydrochlorothiazide  25 mg Oral Daily  . insulin aspart  0-15 Units Subcutaneous 6 times per day  . pantoprazole (PROTONIX) IV  40 mg Intravenous QHS  . thiamine  100 mg Intravenous Daily     ASSESSMENT AND PLAN:  1. Chest Pain/Elevated troponin: Mild troponin elevation with flat trend. Prior cath 7 years ago with no CAD. (no report available, this is per pt). Given flash  pulmonary edema with mildly elevated troponin, will plan cath later today.   2. Acute on Chronic Systolic CHF/ Nonischemic Cardiomyopathy: Echo with LVEF=25-30%. This is unchanged from 2010. He is back on his beta blocker. Would resume ARB as BP tolerates.   3. New Onset Atrial Fibrillation: This patients CHA2DS2-VASc Score and unadjusted Ischemic Stroke Rate (% per year) is equal to 4.8 % stroke rate/year from a score of 4 (CHF, HTN, DM, Age). First episode only lasting less than 1 day. He was on heparin but with hematuria this was stopped. Will need to consider NOAC before discharge. Would continue beta blocker for rate-control.  4. HTN: BP is stable today.   5. Acute hypoxic respiratory failure: He is now extubated.    I have seen him today and specifically addressed multiple issues including atrial fib, CHF and his cardiomyopathy. Cath later today given flash pulmonary edema and elevated troponin.   Michael Roberts  3/31/20177:43 AM

## 2015-06-22 NOTE — Care Management Important Message (Signed)
Important Message  Patient Details  Name: ANDREJS WIEGERS MRN: [id number] Date of Birth: [birth year]   Medicare Important Message Given:  Yes    Erryn Dickison P Cecil 06/22/2015, 12:13 PM

## 2015-06-22 NOTE — Progress Notes (Signed)
PULMONARY / CRITICAL CARE MEDICINE   Name: Michael Roberts MRN: [id number] DOB: [birth year]    ADMISSION DATE:  06/19/2015 CONSULTATION DATE:  06/19/15  REFERRING MD:  EDP  CHIEF COMPLAINT:  SOB   SUBJECTIVE:  Extubated on 3/30 > doing well. On 2L [place]. Comfortable.   VITAL SIGNS: BP 105/70 mmHg  Pulse 65  Temp(Src) 98.6 F (37 C) (Oral)  Resp 17  Ht 6\' 2"  (1.88 m)  Wt 231 lb 0.7 oz (104.8 kg)  BMI 29.65 kg/m2  SpO2 100%  HEMODYNAMICS:    VENTILATOR SETTINGS: Vent Mode:  [-] BIPAP FiO2 (%):  [40 %] 40 %  INTAKE / OUTPUT: I/O last 3 completed shifts: In: 2278.4 [I.V.:646.8; NG/GT:425; IV Piggyback:1206.7] Out: [id number] [Urine:1165; Emesis/NG output:150]   PHYSICAL EXAMINATION: General: Adult male, in NAD. Neuro: Awake and agitated despite sedation. HEENT: [place]/AT. PERRL, sclerae anicteric. Cardiovascular: IRIR, no M/R/G.  Lungs: Respirations even and unlabored.  Less crackles Abdomen: BS x 4, soft, NT/ND.  Musculoskeletal: No gross deformities, no edema.  Skin: Intact, warm, no rashes.  LABS:  BMET  Recent Labs Lab 06/20/15 0420  06/21/15 0345 06/21/15 1445 06/22/15 0430  NA 141  --  141  --  140  K 3.1*  < > 3.3* 4.4 3.6  CL 104  --  108  --  108  CO2 24  --  22  --  23  BUN 15  --  16  --  22*  CREATININE 1.03  --  0.90  --  0.98  GLUCOSE 126*  --  141*  --  128*  < > = values in this interval not displayed.  Electrolytes  Recent Labs Lab 06/20/15 0420 06/21/15 0345 06/22/15 0430  CALCIUM 8.6* 8.6* 8.7*  MG 1.9 2.0 2.0  PHOS 2.7 2.4* 3.3    CBC  Recent Labs Lab 06/20/15 0420 06/21/15 0345 06/22/15 0430  WBC 7.6 6.9 3.9*  HGB 14.4 13.4 13.1  HCT 42.9 41.1 40.9  PLT 198 197 177    Coag's  Recent Labs Lab 06/19/15 0345  APTT 28  INR 1.09    Sepsis Markers  Recent Labs Lab 06/19/15 0403 06/19/15 0620 06/19/15 0930  LATICACIDVEN 10.50* 2.9* 2.2*  PROCALCITON  --  <0.10  --     ABG  Recent Labs Lab 06/19/15 0547  06/19/15 0940 06/20/15 0345  PHART 7.197* 7.452* 7.534*  PCO2ART 60.4* 33.9* 28.8*  PO2ART 90.0 159* 103*    Liver Enzymes  Recent Labs Lab 06/19/15 0345  AST 137*  ALT 43  ALKPHOS 75  BILITOT 1.2  ALBUMIN 3.8    Cardiac Enzymes  Recent Labs Lab 06/19/15 0345 06/19/15 0936 06/19/15 1200  TROPONINI 0.03 0.09* 0.12*    Glucose  Recent Labs Lab 06/21/15 1152 06/21/15 1551 06/21/15 1939 06/21/15 2344 06/22/15 0358 06/22/15 0729  GLUCAP 155* 125* 130* 109* 131* 104*    Imaging No results found.   STUDIES:  CXR 03/28 > vascular congestion with possible RLL PNA. CXR 3/29 > less pulm edema  CULTURES: Blood 03/28 > (-) Sputum 03/28 > (-)  ANTIBIOTICS: Zosyn 03/28 > 3/28 Unasyn 3/28 >> 3/30  SIGNIFICANT EVENTS: 03/28 > admitted with acute hypoxemic and hypercapnic respiratory failure requiring intubation. 3/30 > extubated  LINES/TUBES: ETT 03/28 >  DISCUSSION: 70 y.o. M admitted 03/28 with acute hypoxemic and hypercapnic respiratory failure requiring intubation.  Concern for aspiration given his heavy ETOH use.  ASSESSMENT / PLAN:  PULMONARY A: S/P Acute hypoxemic and hypercapnic  respiratory failure 2/2 flash pulm edema; concern for asp pna P:   Extubated on 3/30 > doing well. On 2L [place]. BipaP prn.  Unasyn dc'd. Not on abx.    CARDIOVASCULAR A:  A.fib - ? New onset; no hx noted in system. CHF EF 25-30%, diffuse hypokinesis Chest pain - initial troponin neg and EKG without obvious ischemic changes. Demand ischemia  P:  Appreciate cardiology input. Plan for LHC today.  On coreg. Start losartan.  Heparin drip dcd 2/2 hematuria   RENAL A:   AKI. Better.  P:   Correct K, electolytes Needs diuresis. Cont coreg/arb  GASTROINTESTINAL A:   Elevated AST - due to ETOH use. GI prophylaxis. P:   SUP: Pantoprazole. CIWA protocol ETOH cessation counseling once extubated.  HEMATOLOGIC A:   VTE Prophylaxis. P:  SCD's  Heparin dc'd  2/2 hematuria   INFECTIOUS A:   Concern for aspiration PNA. P:   On Unasyn > cultures (-) > dcd abx on 3/30. Will observe.   ENDOCRINE A:   DM. P:   SSI. Assess TSH.  NEUROLOGIC A:   Acute encephalopathy - due to hypercarbia + sedation. Hx ETOH use. P:   CIWA protocol  Thiamine / Folate.   Family updated: Wife updated at bedside.   Plan to transfer pt to SDU after LHC. Pt will go to [facility]. Malen Gauze / [facility] service in am, 06/23/15. PCCM will sign off unless with issues post LHC as discussed with Dr. Thereasa Solo.   Monica Becton, MD 06/22/2015, 9:19 AM [facility] Pager [phone] After 3 pm or if no answer, call [phone]

## 2015-06-22 NOTE — Progress Notes (Signed)
Nutrition Follow-up  DOCUMENTATION CODES:   Obesity unspecified  INTERVENTION:   Supplement diet once advanced.   NUTRITION DIAGNOSIS:   Inadequate oral intake related to inability to eat as evidenced by NPO status. Ongoing.   GOAL:   Patient will meet greater than or equal to 90% of their needs Progressing.   MONITOR:   Diet advancement, Labs, I & O's  ASSESSMENT:   69 y.o. M admitted 03/28 with acute hypoxemic and hypercapnic respiratory failure requiring intubation. Concern for aspiration given his heavy ETOH use.  03/28 > admitted with acute hypoxemic and hypercapnic respiratory failure requiring intubation. 3/30 > extubated  Medications reviewed and include: folic acid, thiamine,  CBG's: 98-131  Diet Order:  Diet NPO time specified Except for: Sips with Meds  Skin:  Reviewed, no issues  Last BM:  3/31  Height:   Ht Readings from Last 1 Encounters:  06/19/15 6\' 2"  (1.88 m)    Weight:   Wt Readings from Last 1 Encounters:  06/22/15 231 lb 0.7 oz (104.8 kg)    Ideal Body Weight:  86.36 kg  BMI:  Body mass index is 29.65 kg/(m^2).  Estimated Nutritional Needs:   Kcal:  2100-2300  Protein:  105-115 grams  Fluid:  > 2.1 L/day  EDUCATION NEEDS:   No education needs identified at this time  [place], [place], [place] Pager [phone] After Hours Pager

## 2015-06-23 ENCOUNTER — Inpatient Hospital Stay (HOSPITAL_COMMUNITY): Payer: Medicare Other

## 2015-06-23 LAB — GLUCOSE, CAPILLARY
GLUCOSE-CAPILLARY: 88 mg/dL (ref 65–99)
GLUCOSE-CAPILLARY: 156 mg/dL — AB (ref 65–99)
Glucose-Capillary: 96 mg/dL (ref 65–99)
Glucose-Capillary: 167 mg/dL — ABNORMAL HIGH (ref 65–99)

## 2015-06-23 LAB — URINALYSIS, ROUTINE W REFLEX MICROSCOPIC
BILIRUBIN URINE: NEGATIVE
Glucose, UA: NEGATIVE mg/dL
Ketones, ur: 15 mg/dL — AB
LEUKOCYTES UA: NEGATIVE
NITRITE: NEGATIVE
PH: 5 (ref 5.0–8.0)
Protein, ur: NEGATIVE mg/dL
SPECIFIC GRAVITY, URINE: 1.019 (ref 1.005–1.030)

## 2015-06-23 LAB — CBC
HEMATOCRIT: 42.4 % (ref 39.0–52.0)
Hemoglobin: 13.9 g/dL (ref 13.0–17.0)
MCH: 31.2 pg (ref 26.0–34.0)
MCHC: 32.8 g/dL (ref 30.0–36.0)
MCV: 95.1 fL (ref 78.0–100.0)
PLATELETS: 179 10*3/uL (ref 150–400)
RBC: 4.46 MIL/uL (ref 4.22–5.81)
RDW: 12.7 % (ref 11.5–15.5)
WBC: 5.7 10*3/uL (ref 4.0–10.5)

## 2015-06-23 LAB — BASIC METABOLIC PANEL
ANION GAP: 11 (ref 5–15)
BUN: 14 mg/dL (ref 6–20)
CALCIUM: 8.6 mg/dL — AB (ref 8.9–10.3)
CO2: 21 mmol/L — AB (ref 22–32)
Chloride: 106 mmol/L (ref 101–111)
Creatinine, Ser: 0.96 mg/dL (ref 0.61–1.24)
GFR calc Af Amer: >60 mL/min (ref >60)
GLUCOSE: 106 mg/dL — AB (ref 65–99)
Potassium: 3.9 mmol/L (ref 3.5–5.1)
Sodium: 138 mmol/L (ref 135–145)

## 2015-06-23 LAB — URINE MICROSCOPIC-ADD ON: Bacteria, UA: NONE SEEN

## 2015-06-23 LAB — MAGNESIUM: Magnesium: 1.7 mg/dL (ref 1.7–2.4)

## 2015-06-23 LAB — PHOSPHORUS: Phosphorus: 2.8 mg/dL (ref 2.5–4.6)

## 2015-06-23 MED ORDER — APIXABAN 5 MG PO TABS
5.0000 mg | ORAL_TABLET | Freq: Two times a day (BID) | ORAL | Status: DC
  Administered 2015-06-23 – 2015-06-25 (×5): 5 mg via ORAL
  Filled 2015-06-23 (×5): qty 1

## 2015-06-23 MED ORDER — SACUBITRIL-VALSARTAN 24-26 MG PO TABS
1.0000 | ORAL_TABLET | Freq: Two times a day (BID) | ORAL | Status: DC
  Administered 2015-06-23 – 2015-06-24 (×3): 1 via ORAL
  Filled 2015-06-23 (×5): qty 1

## 2015-06-23 MED ORDER — FOLIC ACID 1 MG PO TABS
1.0000 mg | ORAL_TABLET | Freq: Every day | ORAL | Status: DC
  Administered 2015-06-24 – 2015-06-25 (×2): 1 mg via ORAL
  Filled 2015-06-23 (×2): qty 1

## 2015-06-23 MED ORDER — VITAMIN B-1 100 MG PO TABS
100.0000 mg | ORAL_TABLET | Freq: Every day | ORAL | Status: DC
  Administered 2015-06-24 – 2015-06-25 (×2): 100 mg via ORAL
  Filled 2015-06-23 (×2): qty 1

## 2015-06-23 MED ORDER — INSULIN ASPART 100 UNIT/ML ~~LOC~~ SOLN: 0.0000 [IU] | Freq: Every day | SUBCUTANEOUS | Status: DC

## 2015-06-23 MED ORDER — INSULIN ASPART 100 UNIT/ML ~~LOC~~ SOLN
0.0000 [IU] | Freq: Three times a day (TID) | SUBCUTANEOUS | Status: DC
  Administered 2015-06-23: 2 [IU] via SUBCUTANEOUS
  Administered 2015-06-24: 1 [IU] via SUBCUTANEOUS
  Administered 2015-06-24 – 2015-06-25 (×3): 2 [IU] via SUBCUTANEOUS
  Administered 2015-06-25: 1 [IU] via SUBCUTANEOUS

## 2015-06-23 NOTE — Progress Notes (Signed)
ANTICOAGULATION CONSULT NOTE - Initial Consult  Pharmacy Consult for apixaban Indication: atrial fibrillation  No Known Allergies  Patient Measurements: Height: 6\' 2"  (188 cm) (Verbal by Patient) Weight: 230 lb 2.6 oz (104.4 kg) IBW/kg (Calculated) : 82.2  Vital Signs: Temp: 98.6 F (37 C) (04/01 1156) Temp Source: Oral (04/01 1156) BP: 138/83 mmHg (04/01 1156) Pulse Rate: 82 (04/01 1156)  Labs:  Recent Labs  06/21/15 0345 06/22/15 0430 06/23/15 0314  HGB 13.4 13.1 13.9  HCT 41.1 40.9 42.4  PLT 197 177 179  CREATININE 0.90 0.98 0.96    Estimated Creatinine Clearance: 93.6 mL/min (by C-G formula based on Cr of 0.96).   Medical History: Past Medical History  Diagnosis Date  . Diabetes mellitus without complication ([place])   . Stroke ([place])   . Myocardial infarction ([place])   . Chronic systolic CHF (congestive heart failure) ([facility])     a. EF 30% by echo in 2010  . HTN (hypertension)   . HLD (hyperlipidemia)     Assessment:  70 y/o M w/ CP on 06/19/2015. PMH CHF, HTN, DM. Developed new onset afib w/ chadsvasc score of 4. Pharmacy consulted to start apixaban for afib. Hgb 13.9, plts 179.  Goal of Therapy:  Monitor platelets by anticoagulation protocol: Yes   Plan:  Apixaban 5 mg po BID Monitor CBC and for S&S of bleed Pharmacy will sign off  Angela Burke, PharmD Pharmacy Resident Pager: [phone] 06/23/2015,1:18 PM

## 2015-06-23 NOTE — Discharge Instructions (Addendum)
Information on my medicine - ELIQUIS (apixaban)  This medication education was reviewed with me or my healthcare representative as part of my discharge preparation.  The pharmacist that spoke with me during my hospital stay was:  Myrene Galas, [facility]  Why was Eliquis prescribed for you? Eliquis was prescribed for you to reduce the risk of a blood clot forming that can cause a stroke if you have a medical condition called atrial fibrillation (a type of irregular heartbeat).  What do You need to know about Eliquis ? Take your Eliquis TWICE DAILY - one tablet in the morning and one tablet in the evening with or without food. If you have difficulty swallowing the tablet whole please discuss with your pharmacist how to take the medication safely.  Take Eliquis exactly as prescribed by your doctor and DO NOT stop taking Eliquis without talking to the doctor who prescribed the medication.  Stopping may increase your risk of developing a stroke.  Refill your prescription before you run out.  After discharge, you should have regular check-up appointments with your healthcare provider that is prescribing your Eliquis.  In the future your dose may need to be changed if your kidney function or weight changes by a significant amount or as you get older.  What do you do if you miss a dose? If you miss a dose, take it as soon as you remember on the same day and resume taking twice daily.  Do not take more than one dose of ELIQUIS at the same time to make up a missed dose.  Important Safety Information A possible side effect of Eliquis is bleeding. You should call your healthcare provider right away if you experience any of the following: ? Bleeding from an injury or your nose that does not stop. ? Unusual colored urine (red or dark brown) or unusual colored stools (red or black). ? Unusual bruising for unknown reasons. ? A serious fall or if you hit your head (even if there is no bleeding).  Some  medicines may interact with Eliquis and might increase your risk of bleeding or clotting while on Eliquis. To help avoid this, consult your healthcare provider or pharmacist prior to using any new prescription or non-prescription medications, including herbals, vitamins, non-steroidal anti-inflammatory drugs (NSAIDs) and supplements.  This website has more information on Eliquis (apixaban): http://www.eliquis.com/eliquis/home  Heart Failure  Heart failure is a condition in which the heart has trouble pumping blood. This means your heart does not pump blood efficiently for your body to work well. In some cases of heart failure, fluid may back up into your lungs or you may have swelling (edema) in your lower legs. Heart failure is usually a long-term (chronic) condition. It is important for you to take good care of yourself and follow your health care provider's treatment plan. CAUSES  Some health conditions can cause heart failure. Those health conditions include:  High blood pressure (hypertension). Hypertension causes the heart muscle to work harder than normal. When pressure in the blood vessels is high, the heart needs to pump (contract) with more force in order to circulate blood throughout the body. High blood pressure eventually causes the heart to become stiff and weak.  Coronary artery disease (CAD). CAD is the buildup of cholesterol and fat (plaque) in the arteries of the heart. The blockage in the arteries deprives the heart muscle of oxygen and blood. This can cause chest pain and may lead to a heart attack. High blood pressure can  also contribute to CAD.  Heart attack (myocardial infarction). A heart attack occurs when one or more arteries in the heart become blocked. The loss of oxygen damages the muscle tissue of the heart. When this happens, part of the heart muscle dies. The injured tissue does not contract as well and weakens the heart's ability to pump blood.  Abnormal heart  valves. When the heart valves do not open and close properly, it can cause heart failure. This makes the heart muscle pump harder to keep the blood flowing.  Heart muscle disease (cardiomyopathy or myocarditis). Heart muscle disease is damage to the heart muscle from a variety of causes. These can include drug or alcohol abuse, infections, or unknown reasons. These can increase the risk of heart failure.  Lung disease. Lung disease makes the heart work harder because the lungs do not work properly. This can cause a strain on the heart, leading it to fail.  Diabetes. Diabetes increases the risk of heart failure. High blood sugar contributes to high fat (lipid) levels in the blood. Diabetes can also cause slow damage to tiny blood vessels that carry important nutrients to the heart muscle. When the heart does not get enough oxygen and food, it can cause the heart to become weak and stiff. This leads to a heart that does not contract efficiently.  Other conditions can contribute to heart failure. These include abnormal heart rhythms, thyroid problems, and low blood counts (anemia). Certain unhealthy behaviors can increase the risk of heart failure, including:  Being overweight.  Smoking or chewing tobacco.  Eating foods high in fat and cholesterol.  Abusing illicit drugs or alcohol.  Lacking physical activity. SYMPTOMS  Heart failure symptoms may vary and can be hard to detect. Symptoms may include:  Shortness of breath with activity, such as climbing stairs.  Persistent cough.  Swelling of the feet, ankles, legs, or abdomen.  Unexplained weight gain.  Difficulty breathing when lying flat (orthopnea).  Waking from sleep because of the need to sit up and get more air.  Rapid heartbeat.  Fatigue and loss of energy.  Feeling light-headed, dizzy, or close to fainting.  Loss of appetite.  Nausea.  Increased urination during the night (nocturia). DIAGNOSIS  A diagnosis of heart  failure is based on your history, symptoms, physical examination, and diagnostic tests. Diagnostic tests for heart failure may include:  Echocardiography.  Electrocardiography.  Chest X-ray.  Blood tests.  Exercise stress test.  Cardiac angiography.  Radionuclide scans. TREATMENT  Treatment is aimed at managing the symptoms of heart failure. Medicines, behavioral changes, or surgical intervention may be necessary to treat heart failure.  Medicines to help treat heart failure may include:  Angiotensin-converting enzyme (ACE) inhibitors. This type of medicine blocks the effects of a blood protein called angiotensin-converting enzyme. ACE inhibitors relax (dilate) the blood vessels and help lower blood pressure.  Angiotensin receptor blockers (ARBs). This type of medicine blocks the actions of a blood protein called angiotensin. Angiotensin receptor blockers dilate the blood vessels and help lower blood pressure.  Water pills (diuretics). Diuretics cause the kidneys to remove salt and water from the blood. The extra fluid is removed through urination. This loss of extra fluid lowers the volume of blood the heart pumps.  Beta blockers. These prevent the heart from beating too fast and improve heart muscle strength.  Digitalis. This increases the force of the heartbeat.  Healthy behavior changes include:  Obtaining and maintaining a healthy weight.  Stopping smoking or chewing tobacco.  Eating heart-healthy foods.  Limiting or avoiding alcohol.  Stopping illicit drug use.  Physical activity as directed by your health care provider.  Surgical treatment for heart failure may include:  A procedure to open blocked arteries, repair damaged heart valves, or remove damaged heart muscle tissue.  A pacemaker to improve heart muscle function and control certain abnormal heart rhythms.  An internal cardioverter defibrillator to treat certain serious abnormal heart rhythms.  A left  ventricular assist device (LVAD) to assist the pumping ability of the heart. HOME CARE INSTRUCTIONS   Take medicines only as directed by your health care provider. Medicines are important in reducing the workload of your heart, slowing the progression of heart failure, and improving your symptoms.  Do not stop taking your medicine unless directed by your health care provider.  Do not skip any dose of medicine.  Refill your prescriptions before you run out of medicine. Your medicines are needed every day.  Engage in moderate physical activity if directed by your health care provider. Moderate physical activity can benefit some people. The elderly and people with severe heart failure should consult with a health care provider for physical activity recommendations.  Eat heart-healthy foods. Food choices should be free of trans fat and low in saturated fat, cholesterol, and salt (sodium). Healthy choices include fresh or frozen fruits and vegetables, fish, lean meats, legumes, fat-free or low-fat dairy products, and whole grain or high fiber foods. Talk to a dietitian to learn more about heart-healthy foods.  Limit sodium if directed by your health care provider. Sodium restriction may reduce symptoms of heart failure in some people. Talk to a dietitian to learn more about heart-healthy seasonings.  Use healthy cooking methods. Healthy cooking methods include roasting, grilling, broiling, baking, poaching, steaming, or stir-frying. Talk to a dietitian to learn more about healthy cooking methods.  Limit fluids if directed by your health care provider. Fluid restriction may reduce symptoms of heart failure in some people.  Weigh yourself every day. Daily weights are important in the early recognition of excess fluid. You should weigh yourself every morning after you urinate and before you eat breakfast. Wear the same amount of clothing each time you weigh yourself. Record your daily weight. Provide  your health care provider with your weight record.  Monitor and record your blood pressure if directed by your health care provider.  Check your pulse if directed by your health care provider.  Lose weight if directed by your health care provider. Weight loss may reduce symptoms of heart failure in some people.  Stop smoking or chewing tobacco. Nicotine makes your heart work harder by causing your blood vessels to constrict. Do not use nicotine gum or patches before talking to your health care provider.  Keep all follow-up visits as directed by your health care provider. This is important.  Limit alcohol intake to no more than 1 drink per day for nonpregnant women and 2 drinks per day for men. One drink equals 12 ounces of beer, 5 ounces of wine, or 1 ounces of hard liquor. Drinking more than that is harmful to your heart. Tell your health care provider if you drink alcohol several times a week. Talk with your health care provider about whether alcohol is safe for you. If your heart has already been damaged by alcohol or you have severe heart failure, drinking alcohol should be stopped completely.  Stop illicit drug use.  Stay up-to-date with immunizations. It is especially important to prevent respiratory infections  through current pneumococcal and influenza immunizations.  Manage other health conditions such as hypertension, diabetes, thyroid disease, or abnormal heart rhythms as directed by your health care provider.  Learn to manage stress.  Plan rest periods when fatigued.  Learn strategies to manage high temperatures. If the weather is extremely hot:  Avoid vigorous physical activity.  Use air conditioning or fans or seek a cooler location.  Avoid caffeine and alcohol.  Wear loose-fitting, lightweight, and light-colored clothing.  Learn strategies to manage cold temperatures. If the weather is extremely cold:  Avoid vigorous physical activity.  Layer clothes.  Wear  mittens or gloves, a hat, and a scarf when going outside.  Avoid alcohol.  Obtain ongoing education and support as needed.  Participate in or seek rehabilitation as needed to maintain or improve independence and quality of life. SEEK MEDICAL CARE IF:   You have a rapid weight gain.  You have increasing shortness of breath that is unusual for you.  You are unable to participate in your usual physical activities.  You tire easily.  You cough more than normal, especially with physical activity.  You have any or more swelling in areas such as your hands, feet, ankles, or abdomen.  You are unable to sleep because it is hard to breathe.  You feel like your heart is beating fast (palpitations).  You become dizzy or light-headed upon standing up. SEEK IMMEDIATE MEDICAL CARE IF:   You have difficulty breathing.  There is a change in mental status such as decreased alertness or difficulty with concentration.  You have a pain or discomfort in your chest.  You have an episode of fainting (syncope). MAKE SURE YOU:   Understand these instructions.  Will watch your condition.  Will get help right away if you are not doing well or get worse.   This information is not intended to replace advice given to you by your health care provider. Make sure you discuss any questions you have with your health care provider.   Document Released: 03/10/2005 Document Revised: 07/25/2014 Document Reviewed: 04/09/2012 Elsevier Interactive Patient Education [employer].

## 2015-06-23 NOTE — Progress Notes (Signed)
TEAM 1 - Stepdown/ICU TEAM PROGRESS NOTE  Michael Roberts [id number] DOB: 11-21-1945 DOA: 06/19/2015 PCP: Kennon Portela, MD  Admit HPI / Brief Narrative: 70 y.o. male who was brought to [facility] ED via EMS early AM hours 03/28 with SOB that awoke him from sleep. He had apparently been fine the night prior, and woke up from sleep suddenly with SOB. On EMS arrival, pt had SpO2 42% on RA and complained of chest pain.  He was placed on CPAP and brought to ED.  He ultimately required intubation.  He was able to be extubated 3/30.    HPI/Subjective: The pt had a brief episode of severe SOB again this morning, like prior to his admit.  He is currently w/o complaint.  He denies sob, cp, n/v, or abdom pain at this time.    Assessment/Plan:  S/P Acute hypoxemic and hypercapnic respiratory failure 2/2 flash pulm edema Extubated on 3/30 - requiring only minimal O2 support - f/u CXR this AM w/o evidence of edema  Newly diagnosed A.fib CHA2DS2-VASc Score is 4 - was on heparin but with hematuria this was stopped - Cards starting NOAC today - NSR at present   Severe Chronic Systolic CHF  EF [id number] w/ diffuse hypokinesis - noted on TTE 2010 - perhaps he suffered an acute exacerbation related to Afib w/ RVR at home - CHF Team following   Chest pain - Demand ischemia initial troponin neg and EKG without obvious ischemic changes - LHC revealed no CAD  AKI Resolved - crt now normal  Elevated AST - ETOH use F/u in AM   Concern for aspiration PNA On Unasyn > cultures (-) > dcd abx on 3/30 - no evidence of persisting infection/infiiltrate   DM CBG currently well controlled   Acute encephalopathy due to hypercarbia + sedation - resolved   Code Status: FULL Family Communication: spoke w/ wife at bedside at length  Disposition Plan: SDU - probable transfer to tele 4/2  Consultants: [facility] PCCM  Procedures: 3/28 intubated 3/30 extubated 3/31  LHC  Antibiotics: Zosyn 3/27 Unasyn 3/28 > 3/29  DVT prophylaxis: SCDs  Objective: Blood pressure 158/82, pulse 81, temperature 98.3 F (36.8 C), temperature source Oral, resp. rate 19, height 6\' 2"  (1.88 m), weight 104.4 kg (230 lb 2.6 oz), SpO2 100 %.  Intake/Output Summary (Last 24 hours) at 06/23/15 1131 Last data filed at 06/23/15 1000  Gross per 24 hour  Intake    140 ml  Output   1500 ml  Net  -1360 ml   Exam: General: No acute respiratory distress at rest on bedside  Lungs: Clear to auscultation bilaterally without wheezes or crackles Cardiovascular: Regular rate and rhythm without murmur gallop or rub normal S1 and S2 Abdomen: Nontender, nondistended, soft, bowel sounds positive, no rebound, no ascites, no appreciable mass Extremities: No significant cyanosis, clubbing, or edema bilateral lower extremities  Data Reviewed:  Basic Metabolic Panel:  Recent Labs Lab 06/19/15 0936 06/20/15 0420 06/20/15 1046 06/21/15 0345 06/21/15 1445 06/22/15 0430 06/23/15 0314  NA 141 141  --  141  --  140 138  K 4.5 3.1* 3.6 3.3* 4.4 3.6 3.9  CL 105 104  --  108  --  108 106  CO2 20* 24  --  22  --  23 21*  GLUCOSE 125* 126*  --  141*  --  128* 106*  BUN 15 15  --  16  --  22* 14  CREATININE 1.44* 1.03  --  0.90  --  0.98 0.96  CALCIUM 8.9 8.6*  --  8.6*  --  8.7* 8.6*  MG 2.0 1.9  --  2.0  --  2.0 1.7  PHOS 6.3* 2.7  --  2.4*  --  3.3 2.8    CBC:  Recent Labs Lab 06/19/15 0345 06/20/15 0420 06/21/15 0345 06/22/15 0430 06/23/15 0314  WBC 9.9 7.6 6.9 3.9* 5.7  NEUTROABS 2.0  --   --   --   --   HGB 16.3 14.4 13.4 13.1 13.9  HCT 51.0 42.9 41.1 40.9 42.4  MCV 98.6 93.5 93.4 96.0 95.1  PLT 219 198 197 177 179    Liver Function Tests:  Recent Labs Lab 06/19/15 0345  AST 137*  ALT 43  ALKPHOS 75  BILITOT 1.2  PROT 6.6  ALBUMIN 3.8   Coags:  Recent Labs Lab 06/19/15 0345  INR 1.09    Recent Labs Lab 06/19/15 0345  APTT 28    Cardiac  Enzymes:  Recent Labs Lab 06/19/15 0345 06/19/15 0936 06/19/15 1200  TROPONINI 0.03 0.09* 0.12*    CBG:  Recent Labs Lab 06/22/15 1527 06/22/15 1945 06/22/15 2337 06/23/15 0336 06/23/15 0822  GLUCAP 85 81 122* 88 96    Recent Results (from the past 240 hour(s))  Culture, blood (Routine X 2) w Reflex to ID Panel     Status: None (Preliminary result)   Collection Time: 06/19/15  6:12 AM  Result Value Ref Range Status   Specimen Description BLOOD RIGHT ARM  Final   Special Requests BOTTLES DRAWN AEROBIC AND ANAEROBIC 10ML  Final   Culture NO GROWTH 3 DAYS  Final   Report Status PENDING  Incomplete  Culture, blood (Routine X 2) w Reflex to ID Panel     Status: None (Preliminary result)   Collection Time: 06/19/15  6:22 AM  Result Value Ref Range Status   Specimen Description BLOOD RIGHT HAND  Final   Special Requests IN PEDIATRIC BOTTLE 3ML  Final   Culture NO GROWTH 3 DAYS  Final   Report Status PENDING  Incomplete  MRSA PCR Screening     Status: None   Collection Time: 06/19/15  6:39 AM  Result Value Ref Range Status   MRSA by PCR NEGATIVE NEGATIVE Final    Comment:        The GeneXpert MRSA Assay (FDA approved for NASAL specimens only), is one component of a comprehensive MRSA colonization surveillance program. It is not intended to diagnose MRSA infection nor to guide or monitor treatment for MRSA infections.   Culture, respiratory (NON-Expectorated)     Status: None   Collection Time: 06/19/15  9:47 AM  Result Value Ref Range Status   Specimen Description TRACHEAL ASPIRATE  Final   Special Requests Normal  Final   Gram Stain   Final    RARE WBC PRESENT,BOTH PMN AND MONONUCLEAR NO SQUAMOUS EPITHELIAL CELLS SEEN NO ORGANISMS SEEN Performed at [facility]    Culture   Final    NORMAL OROPHARYNGEAL FLORA Performed at [facility]    Report Status 06/21/2015 FINAL  Final     Studies:   Recent x-ray studies have been reviewed in  detail by the Attending Physician  Scheduled Meds:  Scheduled Meds: . antiseptic oral rinse  7 mL Mouth Rinse BID  . carvedilol  6.25 mg Oral BID WC  . folic acid  1 mg Intravenous Daily  . hydrochlorothiazide  25 mg Oral Daily  .  insulin aspart  0-15 Units Subcutaneous 6 times per day  . losartan  25 mg Oral Daily  . pantoprazole (PROTONIX) IV  40 mg Intravenous QHS  . sodium chloride flush  3 mL Intravenous Q12H  . thiamine  100 mg Intravenous Daily    Time spent on care of this patient: 35 mins   MCCLUNG,JEFFREY T , MD   [facility] Office  [phone] Pager - Text Page per Shea Evans as per below:  On-Call/Text Page:      [email or web address]      password [email or web address]  If 7PM-7AM, please contact night-coverage [email or web address] Password [email or web address] 06/23/2015, 11:31 AM   LOS: 4 days

## 2015-06-23 NOTE — Progress Notes (Signed)
Patient is currently not on bipap and on a 1L [place]. RT will continue to monitor.

## 2015-06-23 NOTE — Progress Notes (Signed)
     SUBJECTIVE:   Cath 3/31. No CAD. LVEDP 18.  No chest pain this am. Minimal dyspnea.  SBP 130-140s mostly.   Tele: sinus tach 80-105  BP 138/83 mmHg  Pulse 82  Temp(Src) 98.6 F (37 C) (Oral)  Resp 13  Ht 6\' 2"  (1.88 m)  Wt 104.4 kg (230 lb 2.6 oz)  BMI 29.54 kg/m2  SpO2 100%  Intake/Output Summary (Last 24 hours) at 06/23/15 1253 Last data filed at 06/23/15 1000  Gross per 24 hour  Intake    130 ml  Output   1500 ml  Net  -1370 ml    PHYSICAL EXAM General: Sitting on side of bed eating.  Psych:  Good affect, responds appropriately Neck: No JVD. No masses noted.  Lungs: Clear bilaterally with no wheezes or rhonci noted.  Heart: RRR with no murmurs noted. Abdomen: Bowel sounds are present. Soft, non-tender.  Extremities: No lower extremity edema.   LABS: Basic Metabolic Panel:  Recent Labs  06/22/15 0430 06/23/15 0314  NA 140 138  K 3.6 3.9  CL 108 106  CO2 23 21*  GLUCOSE 128* 106*  BUN 22* 14  CREATININE 0.98 0.96  CALCIUM 8.7* 8.6*  MG 2.0 1.7  PHOS 3.3 2.8   CBC:  Recent Labs  06/22/15 0430 06/23/15 0314  WBC 3.9* 5.7  HGB 13.1 13.9  HCT 40.9 42.4  MCV 96.0 95.1  PLT 177 179   Cardiac Enzymes: No results for input(s): CKTOTAL, CKMB, CKMBINDEX, TROPONINI in the last 72 hours. Fasting Lipid Panel:  Recent Labs  06/21/15 0350  CHOL 150  HDL 43  LDLCALC 75  TRIG 160*  CHOLHDL 3.5    Current Meds: . antiseptic oral rinse  7 mL Mouth Rinse BID  . carvedilol  6.25 mg Oral BID WC  . folic acid  1 mg Intravenous Daily  . hydrochlorothiazide  25 mg Oral Daily  . insulin aspart  0-15 Units Subcutaneous 6 times per day  . losartan  25 mg Oral Daily  . pantoprazole (PROTONIX) IV  40 mg Intravenous QHS  . sodium chloride flush  3 mL Intravenous Q12H  . thiamine  100 mg Intravenous Daily     ASSESSMENT AND PLAN:  1. Chest Pain/Elevated troponin:  --cath 3/31 no CAD. LVEDP 18  2. Acute on Chronic Systolic CHF/ Nonischemic  Cardiomyopathy:  --Echo with LVEF = 25-30%. This is unchanged from 2010.  --Cath 3/31 no CAD LVEDP 18 --He is back on his beta blocker. --Will start Entresto 24/26. Will have case manager see to help with getting it from [place]  3. New Onset Atrial Fibrillation: This patients CHA2DS2-VASc Score and unadjusted Ischemic Stroke Rate (% per year) is equal to 4.8 % stroke rate/year from a score of 4 (CHF, HTN, DM, Age). First episode only lasting less than 1 day. He was on heparin but with hematuria this was stopped.   -now back in NSR -Start Eliquis 5 bid. If develops recurrent hematuria will need cytology and cystoscopy. Case manager to help with this as well   4. HTN: BP is stable today.   5. Acute hypoxic respiratory failure: He is now extubated.    He is improving. See changes as above. I worry his HF may be quite tenuous. Will need f/u with CHF Clinic and CPX testing post-discharge. Also consideration of ICD.    Glori Bickers MD  4/1/201712:53 PM

## 2015-06-24 LAB — COMPREHENSIVE METABOLIC PANEL
ALBUMIN: 3.1 g/dL — AB (ref 3.5–5.0)
ALT: 19 U/L (ref 17–63)
ANION GAP: 11 (ref 5–15)
AST: 24 U/L (ref 15–41)
Alkaline Phosphatase: 56 U/L (ref 38–126)
BUN: 12 mg/dL (ref 6–20)
CHLORIDE: 102 mmol/L (ref 101–111)
CO2: 24 mmol/L (ref 22–32)
Calcium: 8.8 mg/dL — ABNORMAL LOW (ref 8.9–10.3)
Creatinine, Ser: 0.99 mg/dL (ref 0.61–1.24)
GFR calc Af Amer: >60 mL/min (ref >60)
Glucose, Bld: 148 mg/dL — ABNORMAL HIGH (ref 65–99)
POTASSIUM: 3.4 mmol/L — AB (ref 3.5–5.1)
Sodium: 137 mmol/L (ref 135–145)
Total Bilirubin: 1 mg/dL (ref 0.3–1.2)
Total Protein: 6.8 g/dL (ref 6.5–8.1)

## 2015-06-24 LAB — CBC
HCT: 42.3 % (ref 39.0–52.0)
Hemoglobin: 14.1 g/dL (ref 13.0–17.0)
MCH: 31.2 pg (ref 26.0–34.0)
MCHC: 33.3 g/dL (ref 30.0–36.0)
MCV: 93.6 fL (ref 78.0–100.0)
PLATELETS: 189 10*3/uL (ref 150–400)
RBC: 4.52 MIL/uL (ref 4.22–5.81)
RDW: 12.3 % (ref 11.5–15.5)
WBC: 6.1 10*3/uL (ref 4.0–10.5)

## 2015-06-24 LAB — CULTURE, BLOOD (ROUTINE X 2)
CULTURE: NO GROWTH
CULTURE: NO GROWTH

## 2015-06-24 LAB — GLUCOSE, CAPILLARY
GLUCOSE-CAPILLARY: 136 mg/dL — AB (ref 65–99)
GLUCOSE-CAPILLARY: 179 mg/dL — AB (ref 65–99)
GLUCOSE-CAPILLARY: 172 mg/dL — AB (ref 65–99)
GLUCOSE-CAPILLARY: 153 mg/dL — AB (ref 65–99)

## 2015-06-24 LAB — MAGNESIUM
MAGNESIUM: 1.8 mg/dL (ref 1.7–2.4)
MAGNESIUM: 1.8 mg/dL (ref 1.7–2.4)

## 2015-06-24 MED ORDER — SACUBITRIL-VALSARTAN 49-51 MG PO TABS
1.0000 | ORAL_TABLET | Freq: Two times a day (BID) | ORAL | Status: DC
  Administered 2015-06-24 – 2015-06-25 (×2): 1 via ORAL
  Filled 2015-06-24 (×2): qty 1

## 2015-06-24 MED ORDER — POTASSIUM CHLORIDE CRYS ER 20 MEQ PO TBCR
40.0000 meq | EXTENDED_RELEASE_TABLET | Freq: Once | ORAL | Status: AC
  Administered 2015-06-24: 40 meq via ORAL
  Filled 2015-06-24: qty 2

## 2015-06-24 MED ORDER — POTASSIUM CHLORIDE 10 MEQ/100ML IV SOLN
10.0000 meq | Freq: Once | INTRAVENOUS | Status: AC
  Administered 2015-06-24: 10 meq via INTRAVENOUS
  Filled 2015-06-24: qty 100

## 2015-06-24 MED ORDER — SPIRONOLACTONE 25 MG PO TABS
12.5000 mg | ORAL_TABLET | Freq: Every day | ORAL | Status: DC
  Administered 2015-06-24 – 2015-06-25 (×2): 12.5 mg via ORAL
  Filled 2015-06-24 (×2): qty 1

## 2015-06-24 NOTE — Progress Notes (Signed)
Md notified of pt having increase pvc's and couplets.  Labs being drawn earlier.  Will continue to monitor Saunders Revel T

## 2015-06-24 NOTE — Progress Notes (Signed)
Made Md aware of potassium 3.4 this am.  Will continue to monitor Saunders Revel T

## 2015-06-24 NOTE — Progress Notes (Signed)
TEAM 1 - Stepdown/ICU TEAM PROGRESS NOTE  RODERICH COCKCROFT [id number] DOB: [birth year] DOA: 06/19/2015 PCP: Kennon Portela, MD  Admit HPI / Brief Narrative: 70 y.o. male who was brought to [facility] ED via EMS early AM hours 03/28 with SOB that awoke him from sleep. He had apparently been fine the night prior, and woke up from sleep suddenly with SOB. On EMS arrival, pt had SpO2 42% on RA and complained of chest pain.  He was placed on CPAP and brought to ED.  He ultimately required intubation.  He was able to be extubated 3/30.    HPI/Subjective: The patient states he is feeling much better.  He had no difficulty with shortness of breath last night.  He has not noticed any gross hematuria.  He denies chest pain nausea vomiting or abdominal pain.  He is currently up walking around his room without difficulty.  Assessment/Plan:  S/P Acute hypoxemic and hypercapnic respiratory failure 2/2 flash pulm edema Extubated on 3/30 - now titrated back to RA   Newly diagnosed A.fib CHA2DS2-VASc Score is 4 - was on heparin but with hematuria this was stopped - Cards started NOAC 4/1 - NSR at present   Hematuria w/ heparin  Repeat UA yesterday noted "mod Hgb" via dipstick, but micro noted 0-5 actual RBC suggesting myoglobin on dipstick - no gross heamturia thus far on Eliquis - follow - if develops true hematuria, will require full urologic w/u - discussed w/ pt who will alert [place] if he notes hematuria   Severe Chronic Systolic CHF  EF [id number] w/ diffuse hypokinesis - noted on TTE 2010 - perhaps he suffered an acute exacerbation related to Afib w/ RVR at home - CHF Team following and titrating meds  Filed Weights   06/22/15 0400 06/23/15 0329 06/24/15 0223  Weight: 104.8 kg (231 lb 0.7 oz) 104.4 kg (230 lb 2.6 oz) 101.9 kg (224 lb 10.4 oz)   Chest pain - Demand ischemia initial troponin neg and EKG without obvious ischemic changes - LHC revealed no CAD  AKI Resolved - crt  normal  Elevated AST - ETOH use LFTs have normalized   Concern for aspiration PNA On Unasyn > cultures negative  > dcd abx on 3/30 - no evidence of persisting infection/infiiltrate   DM CBG currently controlled   Acute encephalopathy due to hypercarbia + sedation - resolved   Code Status: FULL Family Communication: spoke w/ wife at bedside at length  Disposition Plan: transfer to tele - possible d/c home next 24-48hrs  Consultants: [facility] Cardiology PCCM  Procedures: 3/28 intubated 3/30 extubated 3/31 LHC  Antibiotics: Zosyn 3/27 Unasyn 3/28 > 3/29  DVT prophylaxis: SCDs  Objective: Blood pressure 103/90, pulse 79, temperature 98.7 F (37.1 C), temperature source Oral, resp. rate 20, height 6\' 2"  (1.88 m), weight 101.9 kg (224 lb 10.4 oz), SpO2 98 %.  Intake/Output Summary (Last 24 hours) at 06/24/15 1110 Last data filed at 06/24/15 [id number]  Gross per 24 hour  Intake    660 ml  Output   1025 ml  Net   -365 ml   Exam: General: No acute respiratory distress while up in room   Lungs: Clear to auscultation bilaterally - no wheezes or crackles Cardiovascular: Regular rate and rhythm without murmur gallop or rub  Abdomen: Nontender, nondistended, soft, bowel sounds positive, no rebound, no ascites, no appreciable mass Extremities: No significant cyanosis, clubbing, edema bilateral lower extremities  Data Reviewed:  Basic Metabolic Panel:  Recent Labs  Lab 06/19/15 0936 06/20/15 0420  06/21/15 0345 06/21/15 1445 06/22/15 0430 06/23/15 0314 06/24/15 0220  NA 141 141  --  141  --  140 138 137  K 4.5 3.1*  < > 3.3* 4.4 3.6 3.9 3.4*  CL 105 104  --  108  --  108 106 102  CO2 20* 24  --  22  --  23 21* 24  GLUCOSE 125* 126*  --  141*  --  128* 106* 148*  BUN 15 15  --  16  --  22* 14 12  CREATININE 1.44* 1.03  --  0.90  --  0.98 0.96 0.99  CALCIUM 8.9 8.6*  --  8.6*  --  8.7* 8.6* 8.8*  MG 2.0 1.9  --  2.0  --  2.0 1.7 1.8  PHOS 6.3* 2.7  --  2.4*  --  3.3 2.8   --   < > = values in this interval not displayed.  CBC:  Recent Labs Lab 06/19/15 0345 06/20/15 0420 06/21/15 0345 06/22/15 0430 06/23/15 0314 06/24/15 0220  WBC 9.9 7.6 6.9 3.9* 5.7 6.1  NEUTROABS 2.0  --   --   --   --   --   HGB 16.3 14.4 13.4 13.1 13.9 14.1  HCT 51.0 42.9 41.1 40.9 42.4 42.3  MCV 98.6 93.5 93.4 96.0 95.1 93.6  PLT 219 198 197 177 179 189    Liver Function Tests:  Recent Labs Lab 06/19/15 0345 06/24/15 0220  AST 137* 24  ALT 43 19  ALKPHOS 75 56  BILITOT 1.2 1.0  PROT 6.6 6.8  ALBUMIN 3.8 3.1*   Coags:  Recent Labs Lab 06/19/15 0345  INR 1.09    Recent Labs Lab 06/19/15 0345  APTT 28    Cardiac Enzymes:  Recent Labs Lab 06/19/15 0345 06/19/15 0936 06/19/15 1200  TROPONINI 0.03 0.09* 0.12*    CBG:  Recent Labs Lab 06/23/15 0336 06/23/15 0822 06/23/15 1559 06/23/15 2124 06/24/15 0757  GLUCAP 88 96 167* 156* 136*    Recent Results (from the past 240 hour(s))  Culture, blood (Routine X 2) w Reflex to ID Panel     Status: None (Preliminary result)   Collection Time: 06/19/15  6:12 AM  Result Value Ref Range Status   Specimen Description BLOOD RIGHT ARM  Final   Special Requests BOTTLES DRAWN AEROBIC AND ANAEROBIC 10ML  Final   Culture NO GROWTH 4 DAYS  Final   Report Status PENDING  Incomplete  Culture, blood (Routine X 2) w Reflex to ID Panel     Status: None (Preliminary result)   Collection Time: 06/19/15  6:22 AM  Result Value Ref Range Status   Specimen Description BLOOD RIGHT HAND  Final   Special Requests IN PEDIATRIC BOTTLE 3ML  Final   Culture NO GROWTH 4 DAYS  Final   Report Status PENDING  Incomplete  MRSA PCR Screening     Status: None   Collection Time: 06/19/15  6:39 AM  Result Value Ref Range Status   MRSA by PCR NEGATIVE NEGATIVE Final    Comment:        The GeneXpert MRSA Assay (FDA approved for NASAL specimens only), is one component of a comprehensive MRSA colonization surveillance  program. It is not intended to diagnose MRSA infection nor to guide or monitor treatment for MRSA infections.   Culture, respiratory (NON-Expectorated)     Status: None   Collection Time: 06/19/15  9:47 AM  Result Value  Ref Range Status   Specimen Description TRACHEAL ASPIRATE  Final   Special Requests Normal  Final   Gram Stain   Final    RARE WBC PRESENT,BOTH PMN AND MONONUCLEAR NO SQUAMOUS EPITHELIAL CELLS SEEN NO ORGANISMS SEEN Performed at [facility]    Culture   Final    NORMAL OROPHARYNGEAL FLORA Performed at [facility]    Report Status 06/21/2015 FINAL  Final     Studies:   Recent x-ray studies have been reviewed in detail by the Attending Physician  Scheduled Meds:  Scheduled Meds: . antiseptic oral rinse  7 mL Mouth Rinse BID  . apixaban  5 mg Oral BID  . carvedilol  6.25 mg Oral BID WC  . folic acid  1 mg Oral Daily  . hydrochlorothiazide  25 mg Oral Daily  . insulin aspart  0-5 Units Subcutaneous QHS  . insulin aspart  0-9 Units Subcutaneous TID WC  . sacubitril-valsartan  1 tablet Oral BID  . thiamine  100 mg Oral Daily    Time spent on care of this patient: 35 mins   Zeenat Jeanbaptiste T , MD   [facility] Office  [phone] Pager - Text Page per Shea Evans as per below:  On-Call/Text Page:      [email or web address]      password [email or web address]  If 7PM-7AM, please contact night-coverage [email or web address] Password [email or web address] 06/24/2015, 11:10 AM   LOS: 5 days

## 2015-06-24 NOTE — Evaluation (Signed)
Physical Therapy Evaluation Patient Details Name: Michael Roberts MRN: [id number] DOB: 01-Jul-1945 Today's Date: 06/24/2015   History of Present Illness  Patient is a 70 yo male admitted 06/19/15 after waking from sleep with SOB.  O2 sats 42% on room air.  On CPAP and then intubated.  Respiratory failure with hypoxia.  Patient extubated 06/21/15.  Patient with new Afib.  Card cath - no CAD.    PMH:  CHF, NICM, EF 25-30%, ETOH, DM, HTN    Clinical Impression  Patient is functioning at independent level with all mobility and gait on room air.  Patient with good balance during gait.  No further acute PT needs identified - PT will sign off.  Encouraged ambulation in hallway with supervision.    Follow Up Recommendations No PT follow up    Equipment Recommendations  None recommended by PT    Recommendations for Other Services       Precautions / Restrictions Precautions Precautions: None Restrictions Weight Bearing Restrictions: No      Mobility  Bed Mobility Overal bed mobility: Independent                Transfers Overall transfer level: Independent Equipment used: None                Ambulation/Gait Ambulation/Gait assistance: Independent Ambulation Distance (Feet): 48 Feet Assistive device: None Gait Pattern/deviations: Step-through pattern;Decreased stride length Gait velocity: decreased Gait velocity interpretation: Below normal speed for age/gender General Gait Details: Patient with good gait pattern and balance.  Decreased speed.  Stairs            Wheelchair Mobility    Modified Rankin (Stroke Patients Only)       Balance Overall balance assessment: Independent                           High level balance activites: Direction changes;Turns;Sudden stops;Head turns High Level Balance Comments: No loss of balance with high level balance activities             Pertinent Vitals/Pain Pain Assessment: No/denies pain    Home  Living Family/patient expects to be discharged to:: Private residence Living Arrangements: Spouse/significant other Available Help at Discharge: Family;Available 24 hours/day Type of Home: House Home Access: Stairs to enter Entrance Stairs-Rails: Psychiatric nurse of Steps: 7 Home Layout: One level Home Equipment: Shower seat      Prior Function Level of Independence: Independent         Comments: Active.  Exercises regularly     Hand Dominance        Extremity/Trunk Assessment   Upper Extremity Assessment: Overall WFL for tasks assessed (Arthritic changes Bil 5th fingers.  "Numbness" Rt 1st finger)           Lower Extremity Assessment: Overall WFL for tasks assessed         Communication   Communication: No difficulties  Cognition Arousal/Alertness: Awake/alert Behavior During Therapy: WFL for tasks assessed/performed Overall Cognitive Status: Within Functional Limits for tasks assessed                      General Comments      Exercises        Assessment/Plan    PT Assessment Patent does not need any further PT services  PT Diagnosis Abnormality of gait;Generalized weakness   PT Problem List    PT Treatment Interventions     PT Goals (Current  goals can be found in the Care Plan section) Acute Rehab PT Goals PT Goal Formulation: All assessment and education complete, DC therapy    Frequency     Barriers to discharge        Co-evaluation               End of Session   Activity Tolerance: Patient tolerated treatment well Patient left: in bed;with call bell/phone within reach (sitting EOB) Nurse Communication: Mobility status (No PT needs)         Time: 1456-1510 PT Time Calculation (min) (ACUTE ONLY): 14 min   Charges:   PT Evaluation $PT Eval Low Complexity: 1 Procedure     PT G CodesDespina Pole 07-05-2015, 4:23 PM Carita Pian. Sanjuana Kava, [place] Pager [phone]

## 2015-06-24 NOTE — Progress Notes (Signed)
Pt. With runs of VTach. VSS. Pt. Asymptomatic and denies any pain or discomfort. Pt. Currently resting in bed quietly. RN will continue to monitor pt. For changes in condition.  On call Np, Fredirick Maudlin notified via text page.

## 2015-06-24 NOTE — Progress Notes (Addendum)
SUBJECTIVE:   Cath 3/31. No CAD. LVEDP 18.    Entresto and Eliquis started yesterday.   Continues to feel better. No CP or dyspnea. Weight down 6 pounds. Says this is best his BP and weight has been in a long time.   Tele: sinus 80-90s (reviewed personally)  BP 143/85 mmHg  Pulse 79  Temp(Src) 98.5 F (36.9 C) (Oral)  Resp 18  Ht 6\' 2"  (1.88 m)  Wt 101.9 kg (224 lb 10.4 oz)  BMI 28.83 kg/m2  SpO2 98%  Intake/Output Summary (Last 24 hours) at 06/24/15 1413 Last data filed at 06/24/15 1400  Gross per 24 hour  Intake    710 ml  Output    975 ml  Net   -265 ml    PHYSICAL EXAM General: Sitting on bed Psych:  Good affect, responds appropriately Neck: JVP 7 No masses noted.  Lungs: Clear bilaterally with no wheezes or rhonci noted.  Heart: RRR with no murmurs noted. Abdomen: Bowel sounds are present. Soft, non-tender.  Extremities: No lower extremity edema.   LABS: Basic Metabolic Panel:  Recent Labs  06/22/15 0430 06/23/15 0314 06/24/15 0220  NA 140 138 137  K 3.6 3.9 3.4*  CL 108 106 102  CO2 23 21* 24  GLUCOSE 128* 106* 148*  BUN 22* 14 12  CREATININE 0.98 0.96 0.99  CALCIUM 8.7* 8.6* 8.8*  MG 2.0 1.7 1.8  PHOS 3.3 2.8  --    CBC:  Recent Labs  06/23/15 0314 06/24/15 0220  WBC 5.7 6.1  HGB 13.9 14.1  HCT 42.4 42.3  MCV 95.1 93.6  PLT 179 189   Cardiac Enzymes: No results for input(s): CKTOTAL, CKMB, CKMBINDEX, TROPONINI in the last 72 hours. Fasting Lipid Panel: No results for input(s): CHOL, HDL, LDLCALC, TRIG, CHOLHDL, LDLDIRECT in the last 72 hours.  Current Meds: . antiseptic oral rinse  7 mL Mouth Rinse BID  . apixaban  5 mg Oral BID  . carvedilol  6.25 mg Oral BID WC  . folic acid  1 mg Oral Daily  . hydrochlorothiazide  25 mg Oral Daily  . insulin aspart  0-5 Units Subcutaneous QHS  . insulin aspart  0-9 Units Subcutaneous TID WC  . sacubitril-valsartan  1 tablet Oral BID  . thiamine  100 mg Oral Daily     ASSESSMENT  AND PLAN:  1. Chest Pain/Elevated troponin:  --cath 3/31 no CAD. LVEDP 18  2. Acute on Chronic Systolic CHF/ Nonischemic Cardiomyopathy:  --Echo with LVEF = 25-30%. This is unchanged from 2010.  --Cath 3/31 no CAD LVEDP 18 --He is back on his beta blocker. Volume status ok.  --Entresto 24/26 started 4/1. Will increase to 49/51 amd add spiro 12.5. Will have case manager see to help with getting it from [place]  3. New Onset Atrial Fibrillation: This patients CHA2DS2-VASc Score and unadjusted Ischemic Stroke Rate (% per year) is equal to 4.8 % stroke rate/year from a score of 4 (CHF, HTN, DM, Age). First episode only lasting less than 1 day. He was on heparin but with hematuria this was stopped.   -now back in NSR -Eliquis 5 bid started 4/1. If develops recurrent hematuria will need cytology and cystoscopy. Case manager to help with this as well   4. HTN: BP is stable today.   5. Acute hypoxic respiratory failure: He is now extubated.    6. Hypokalemia: will supp   Overall improved but HR still elevated with minimal activity. We  had long talk about him needing close f/u for his HF with HF specialist. He current has cardiologist at [facility] but is also seen at [facility]. There is [facility] HF clinic at [facility] and he will try to follow-up with thme. If not will come to ur HF Clinic. Will need f/u with CPX testing post-discharge. Also consideration of ICD. May benefit from ivabradine at some point.   With increase of Entresto and addition of spiro can stop HCTZ.    Glori Bickers MD  4/2/20172:13 PM

## 2015-06-25 ENCOUNTER — Other Ambulatory Visit: Payer: Self-pay

## 2015-06-25 ENCOUNTER — Encounter (HOSPITAL_COMMUNITY): Payer: Self-pay | Admitting: Cardiovascular Disease

## 2015-06-25 DIAGNOSIS — J9621 Acute and chronic respiratory failure with hypoxia: Secondary | ICD-10-CM

## 2015-06-25 LAB — CBC
HCT: 43.9 % (ref 39.0–52.0)
Hemoglobin: 14.6 g/dL (ref 13.0–17.0)
MCH: 31.2 pg (ref 26.0–34.0)
MCHC: 33.3 g/dL (ref 30.0–36.0)
MCV: 93.8 fL (ref 78.0–100.0)
PLATELETS: 201 10*3/uL (ref 150–400)
RBC: 4.68 MIL/uL (ref 4.22–5.81)
RDW: 12.3 % (ref 11.5–15.5)
WBC: 5.8 10*3/uL (ref 4.0–10.5)

## 2015-06-25 LAB — MAGNESIUM: Magnesium: 1.9 mg/dL (ref 1.7–2.4)

## 2015-06-25 LAB — BASIC METABOLIC PANEL
ANION GAP: 10 (ref 5–15)
BUN: 13 mg/dL (ref 6–20)
CALCIUM: 8.8 mg/dL — AB (ref 8.9–10.3)
CO2: 26 mmol/L (ref 22–32)
Chloride: 102 mmol/L (ref 101–111)
Creatinine, Ser: 0.85 mg/dL (ref 0.61–1.24)
GLUCOSE: 185 mg/dL — AB (ref 65–99)
Potassium: 3.1 mmol/L — ABNORMAL LOW (ref 3.5–5.1)
Sodium: 138 mmol/L (ref 135–145)

## 2015-06-25 LAB — GLUCOSE, CAPILLARY
GLUCOSE-CAPILLARY: 180 mg/dL — AB (ref 65–99)
Glucose-Capillary: 130 mg/dL — ABNORMAL HIGH (ref 65–99)

## 2015-06-25 MED ORDER — SPIRONOLACTONE 25 MG PO TABS: 12.5000 mg | ORAL_TABLET | Freq: Every day | ORAL | Status: AC

## 2015-06-25 MED ORDER — APIXABAN 5 MG PO TABS: 5.0000 mg | ORAL_TABLET | Freq: Two times a day (BID) | ORAL | Status: DC

## 2015-06-25 MED ORDER — POTASSIUM CHLORIDE CRYS ER 20 MEQ PO TBCR
40.0000 meq | EXTENDED_RELEASE_TABLET | Freq: Once | ORAL | Status: AC
  Administered 2015-06-25: 40 meq via ORAL
  Filled 2015-06-25: qty 2

## 2015-06-25 MED ORDER — CARVEDILOL 6.25 MG PO TABS: 6.2500 mg | ORAL_TABLET | Freq: Two times a day (BID) | ORAL | Status: AC

## 2015-06-25 MED ORDER — SACUBITRIL-VALSARTAN 49-51 MG PO TABS: 1.0000 | ORAL_TABLET | Freq: Two times a day (BID) | ORAL | Status: DC

## 2015-06-25 MED ORDER — POTASSIUM CHLORIDE CRYS ER 20 MEQ PO TBCR: 40.0000 meq | EXTENDED_RELEASE_TABLET | Freq: Once | ORAL | Status: DC

## 2015-06-25 MED FILL — Verapamil HCl IV Soln 2.5 MG/ML: INTRAVENOUS | Qty: 2 | Status: AC

## 2015-06-25 MED FILL — ENTRESTO 49 MG-51 MG TABLET: 49-51 | 30 days supply | Qty: 60 | Fill #0

## 2015-06-25 MED FILL — ELIQUIS 5 MG TABLET: 5 | 30 days supply | Qty: 60 | Fill #0

## 2015-06-25 NOTE — Progress Notes (Signed)
Heart Failure Navigator Consult Note  Presentation: Michael Roberts is a 70 y.o. male with past medical history of chronic systolic CHF (EF [id number] by echo in 2010), Type 2 DM, HTN, alcohol abuse, and CVA who presented to [facility] ED on 06/19/2015 for shortness of breath and chest pain since earlier that morning.   By review of records, his oxygen saturation were at 42% on RA when EMS arrived and he was placed on CPAP. In the ED, his blood gas showed a pH of 7.087 with CO2 of 66.7 and he was intubated. EKG showed atrial fibrillation with RVR with HR of 123 and a LBBB (no previous tracings available for comparison). Heparin was started at that time. CXR showed vascular congestion with bilateral central and bibasilar airspace opacities which may reflect pulmonary edema or pneumonia. He was started on Unasyn per CCM.  Overnight, he converted to NSR and an EKG confirms this. Cyclic troponin values have been 0.03, 0.09, and 0.12. K+ 3.1. Creatinine 1.03. WBC 7.6. Hgb 14.4. Platelets 198. UDS positive for Benzodiazepines and THC.  The patient is currently intubated but awake. He communicates by writing on a notepad and his wife is at the bedside contributing to the history.  By report, he is an active individual, lifting weights several times per week and walking daily. He was doing well and had no symptoms until he became short of breath and diaphoretic yesterday morning. He had a pressure in his chest at that time as well. Denies any prior history of chest pain or discomfort.  He is currently followed by the [facility] in [place]. Our records show his last echocardiogram in 2010 showed an EF of 30% and a cardiac catheterization was recommended for further assessment. The patient and his wife report this was performed in 2010 and was "normal".  He reports his sister had an MI in her 20's. Social history is significant for 2 shots of alcohol per day as report by the patient, but his wife had  told nursing staff his intake is significantly more. Also uses Marijuana weekly. Denies any prior tobacco use   Past Medical History  Diagnosis Date  . Diabetes mellitus without complication ([place])   . Stroke ([place])   . Myocardial infarction ([place])   . Chronic systolic CHF (congestive heart failure) (HCC)     a. EF 30% by echo in 2010  . HTN (hypertension)   . HLD (hyperlipidemia)     Social History   Social History  . Marital Status: Married    Spouse Name: N/A  . Number of Children: N/A  . Years of Education: N/A   Social History Main Topics  . Smoking status: Never Smoker   . Smokeless tobacco: None  . Alcohol Use: 0.0 oz/week    0 Standard drinks or equivalent per week     Comment: "drinks heavy" per wife  . Drug Use: No  . Sexual Activity: Not Asked   Other Topics Concern  . None   Social History Narrative    ECHO:Study Conclusions  - Left ventricle: The cavity size was normal. There was mild  concentric hypertrophy. Systolic function was moderately reduced.  The estimated ejection fraction was in the range of 25% to 30%.  Diffuse hypokinesis. Akinesis of the inferior and inferoseptal  myocardium. Doppler parameters are consistent with abnormal left  ventricular relaxation (grade 1 diastolic dysfunction). Doppler  parameters are consistent with high ventricular filling pressure. - Aortic valve: Transvalvular velocity was within the normal  range.  There was no stenosis. There was mild regurgitation. - Aorta: Ascending aortic AP diameter: 3.9 mm. - Ascending aorta: The ascending aorta was mildly dilated. - Mitral valve: There was mild regurgitation. - Right ventricle: The cavity size was normal. Wall thickness was  normal. Systolic function was normal. - Tricuspid valve: Structurally normal valve. There was no  regurgitation. - Inferior vena cava: The vessel was normal in size. The  respirophasic diameter changes were in the normal range (>= 50%),   consistent with normal central venous pressure.  Echocardiography. M-mode, complete 2D, spectral Doppler, and color Doppler. Birthdate: Patient birthdate: Feb 16, 1946. Age: Patient is 70 yr old. Sex: Gender: male. BMI: 30.2 kg/m^2. Blood pressure: 93/68 Patient status: Inpatient. Study date: Study date: 06/19/2015. Study time: 01:50 PM. Location: ICU/CCU  BNP    Component Value Date/Time   BNP 452.8* 06/19/2015 0930    ProBNP No results found for: PROBNP   Education Assessment and Provision:  Detailed education and instructions provided on heart failure disease management including the following:  Signs and symptoms of Heart Failure When to call the physician Importance of daily weights Low sodium diet Fluid restriction Medication management Anticipated future follow-up appointments  Patient education given on each of the above topics.  Patient acknowledges understanding and acceptance of all instructions.  I spoke with Mr. Helsley regarding his HF.  He tells me that he has a scale.  I have reinforced the importance of daily weights and how they relate to the signs and symptoms of HF as well as when to contact the physician.  He says that he does not use table salt and cooks fresh mostly.  I reviewed a low sodium diet and high sodium foods to avoid.  He denies any issues getting or taking prescribed medications.  He admits to drinking ETOH and says that he still drinks "shots" daily--yet has been trying to "cut back".  I reinforced the need to stop drinking and the detrimental effect it is having on his heart.  He receives his care at the [place] and is very reluctant to come to the AHF Clinic for follow-up due to cost concerns.  I have advised him to follow-up with the [place] and tell them that he has been referred to the AHF Clinic and ask about steps to have those visits covered with his VA benefits.   High Risk Criteria for Readmission and/or Poor Patient  Outcomes:  (Recommend Follow-up with Advanced Heart Failure Clinic)--yes he would benefit from outpatient follow-up with AHF Clinic--however refuses at this time secondary to cost.   EF <30%- 25-30 % with grade 1 dias dys  2 or more admissions in 6 months- No  Difficult social situation- Yes-admits to ETOH use- ? addiction  Demonstrates medication noncompliance- No? denies    Barriers of Care:  Knowledge, compliance and ? ETOH abuse.  Discharge Planning:   Plans to return to home with wife in [place].  I have referred him to [facility] for outpatient Care Management.

## 2015-06-25 NOTE — Evaluation (Signed)
Occupational Therapy Evaluation Patient Details Name: Michael Roberts MRN: [id number] DOB: July 16, 1945 Today's Date: 06/25/2015    History of Present Illness Patient is a 70 yo male admitted 06/19/15 after waking from sleep with SOB.  O2 sats 42% on room air.  On CPAP and then intubated.  Respiratory failure with hypoxia.  Patient extubated 06/21/15.  Patient with new Afib.  Card cath - no CAD.    PMH:  CHF, NICM, EF 25-30%, ETOH, DM, HTN   Clinical Impression   Patient evaluated by Occupational Therapy with no further acute OT needs identified. All education has been completed and the patient has no further questions. See below for any follow-up Occupational Therapy or equipment needs. OT to sign off. Thank you for referral.      Follow Up Recommendations  No OT follow up    Equipment Recommendations  None recommended by OT    Recommendations for Other Services       Precautions / Restrictions Precautions Precautions: None      Mobility Bed Mobility Overal bed mobility: Independent                Transfers Overall transfer level: Independent                    Balance Overall balance assessment: Independent                                          ADL Overall ADL's : Modified independent;At baseline                                       General ADL Comments: Educated on energy conservation and provided handout. pt demonstrates mod I transfer. Pt requesting to shower with IV d/c . Rn notified and RN awaiting d/c orders.      Vision     Perception     Praxis      Pertinent Vitals/Pain Pain Assessment: No/denies pain     Hand Dominance Right   Extremity/Trunk Assessment Upper Extremity Assessment Upper Extremity Assessment: Overall WFL for tasks assessed   Lower Extremity Assessment Lower Extremity Assessment: Defer to PT evaluation   Cervical / Trunk Assessment Cervical / Trunk Assessment: Normal    Communication Communication Communication: No difficulties   Cognition Arousal/Alertness: Awake/alert Behavior During Therapy: WFL for tasks assessed/performed Overall Cognitive Status: Within Functional Limits for tasks assessed                     General Comments       Exercises       Shoulder Instructions      Home Living Family/patient expects to be discharged to:: Private residence Living Arrangements: Spouse/significant other Available Help at Discharge: Family;Available 24 hours/day Type of Home: House Home Access: Stairs to enter [employer] of Steps: 7 Entrance Stairs-Rails: Right;Left Home Layout: One level     Bathroom Shower/Tub: Walk-in shower         Home Equipment: Shower seat          Prior Functioning/Environment Level of Independence: Independent        Comments: Active.  Exercises regularly    OT Diagnosis:     OT Problem List:     OT Treatment/Interventions:      OT  Goals(Current goals can be found in the care plan section)    OT Frequency:     Barriers to D/C:            Co-evaluation              End of Session    Activity Tolerance: Patient tolerated treatment well Patient left: in bed;with call bell/phone within reach   Time: [id number] OT Time Calculation (min): 9 min Charges:  OT General Charges $OT Visit: 1 Procedure OT Evaluation $OT Eval Low Complexity: 1 Procedure G-Codes:    Peri Maris Jul 14, 2015, 1:22 PM   Jeri Modena   OTR/L Pager: [phone] Office: [phone] .

## 2015-06-25 NOTE — Patient Outreach (Signed)
[facility]) Care Management  06/25/2015  Michael Roberts 1945/12/02 [id number]   This RNCM made call to patient and daughter to reschedule home visit for Friday, April 7. Daughter identified patient by providing date of birth and address of patient.  Daughter agreed to home visit to be rescheduled.

## 2015-06-25 NOTE — Consult Note (Signed)
   [facility] CM Inpatient Consult   06/25/2015  Michael Roberts 08/11/1945 [id number] Patient evaluated for community based chronic disease management services with [facility] [facility] as a benefit of patient's [facility]. Spoke with patient and wife, Michael Roberts,  at bedside to explain [facility] services. Patient endorses that his primary care provider is Dr. Ivar Bury (also listed on his card). He also says he goes to the [facility] for care as well because of no co-pays to see a physician.  Consent form signed.    Patient will receive post hospital discharge call and will be evaluated for monthly home visits for assessments and disease process education.  Left contact information and [facility] literature at bedside. Made Inpatient Case Manager aware that [place] Management following who also has spoken to the patient about the EMMI HF calls.  . Of note, [facility] Care Management services does not replace or interfere with any services that are arranged by inpatient case management or social work.  For additional questions or referrals please contact:   Natividad Brood, RN BSN [facility] Liaison  [phone] business mobile phone Toll free office [phone]

## 2015-06-25 NOTE — Progress Notes (Signed)
SUBJECTIVE:   Cath 3/31. No CAD. LVEDP 18.    Entresto and Eliquis started 06/23/15. Case manager to help get from [place] to improve.  Still coughing up white/clear sputum. Denies CP or dyspnea. Weight down another lb. BP stable.  He re-iterates that he wishes [facility] at [facility].   Tele: Reviewed, NSR 80-90s, occassionally up into 100s likely with activity  BP 132/79 mmHg  Pulse 71  Temp(Src) 98.4 F (36.9 C) (Oral)  Resp 19  Ht 6\' 2"  (1.88 m)  Wt 98.793 kg (217 lb 12.8 oz)  BMI 27.95 kg/m2  SpO2 95%  Intake/Output Summary (Last 24 hours) at 06/25/15 0920 Last data filed at 06/25/15 0905  Gross per 24 hour  Intake    970 ml  Output    450 ml  Net    520 ml    PHYSICAL EXAM General: Lying in bed, NAD. Psych:  Good affect, responds appropriately Neck: JVP 7-8 No masses noted.  Lungs: CTAB, normal effort Heart: RRR with no murmurs noted.  Abdomen: soft, NT, ND, no HSM. No bruits or masses. +BS  Extremities: No lower extremity edema.   LABS: Basic Metabolic Panel:  Recent Labs  06/23/15 0314 06/24/15 0220 06/24/15 2220 06/25/15 0419  NA 138 137  --  138  K 3.9 3.4*  --  3.1*  CL 106 102  --  102  CO2 21* 24  --  26  GLUCOSE 106* 148*  --  185*  BUN 14 12  --  13  CREATININE 0.96 0.99  --  0.85  CALCIUM 8.6* 8.8*  --  8.8*  MG 1.7 1.8 1.8 1.9  PHOS 2.8  --   --   --    CBC:  Recent Labs  06/24/15 0220 06/25/15 0419  WBC 6.1 5.8  HGB 14.1 14.6  HCT 42.3 43.9  MCV 93.6 93.8  PLT 189 201   Cardiac Enzymes: No results for input(s): CKTOTAL, CKMB, CKMBINDEX, TROPONINI in the last 72 hours. Fasting Lipid Panel: No results for input(s): CHOL, HDL, LDLCALC, TRIG, CHOLHDL, LDLDIRECT in the last 72 hours.  Current Meds: . antiseptic oral rinse  7 mL Mouth Rinse BID  . apixaban  5 mg Oral BID  . carvedilol  6.25 mg Oral BID WC  . folic acid  1 mg Oral Daily  . insulin aspart  0-5 Units Subcutaneous QHS  . insulin aspart   0-9 Units Subcutaneous TID WC  . potassium chloride  40 mEq Oral Once  . sacubitril-valsartan  1 tablet Oral BID  . spironolactone  12.5 mg Oral Daily  . thiamine  100 mg Oral Daily     ASSESSMENT AND PLAN:  1. Chest Pain/Elevated troponin:  --cath 3/31 no CAD. LVEDP 18  2. Acute on Chronic Systolic CHF/ Nonischemic Cardiomyopathy:  --Echo with LVEF = 25-30%. This is unchanged from 2010.  --Cath 3/31 no CAD LVEDP 18 --He is back on his beta blocker. Volume status ok.  -- Continue Entresto 49/51 and spiro 12.5. Will have case manager see to help with getting Entresto from [place]  3. New Onset Atrial Fibrillation: This patients CHA2DS2-VASc Score and unadjusted Ischemic Stroke Rate (% per year) is equal to 4.8 % stroke rate/year from a score of 4 (CHF, HTN, DM, Age). First episode only lasting less than 1 day. He was on heparin but with hematuria this was stopped.  - Remains in NSR -Eliquis 5 bid  started 4/1. If develops recurrent hematuria will need cytology and cystoscopy.  - Case manager to help with Eliquis as well   4. HTN: BP is stable today.   5. Acute hypoxic respiratory failure: He is now extubated.    6. Hypokalemia: K 3.1 this am. Will supplement. (will give 40 meq BID today)  Overall improved. His plan is to f/u with [facility] HF clinic at [facility].  If not will come to our HF Clinic. Will need CPX testing post-discharge to look at degree of functional limitation (HR elevates with minimal activity.)  Also consideration of ICD. May benefit from ivabradine at some point.   AHF Clinic information placed in his AVS. Pt instructed to call for appt if he cannot/does not get follow up in [place].  Lance Sell  4/3/20179:20 AM  Advanced Heart Failure Team Pager [phone] (M-F; 7a - 4p)  Please contact [facility] for night-coverage after hours (4p -7a ) and weekends on [email or web address]    Patient seen and examined with Oda Kilts, PA-C. We discussed all  aspects of the encounter. I agree with the assessment and plan as stated above.   Ok to d/c today on:  Eliquis 5 bid Entresto 49/51bid Spiro 12.5 daily Carvedilol 6.25 bid Simva 20 Metformin 500 bid  Stop HCTZ, losartan, ASA  [facility] with [facility].   Check BMET 1 week.   Anely Spiewak,MD 9:22 AM

## 2015-06-25 NOTE — Progress Notes (Signed)
Coupon cards given to patient for Entresto and Eliquis.  CM instructed patient to activate his coupon card prior to usage, also pt is instructed to go to the [facility] to get his medication at discharge.   Attending MD: Patient will need 2 prescriptions for Entresto and Eliquis. The first set of prescriptions will be used for the coupons with no refills and the second prescriptions will be sent to the [place] at [place] for ongoing medication  / also medication is less expensive through the [place] when approved. Patient is also agreeable to the [facility] program with [facility] for CHF; B Pennie Rushing [phone]

## 2015-06-25 NOTE — Care Management Important Message (Signed)
Important Message  Patient Details  Name: Michael Roberts MRN: [id number] Date of Birth: 07/29/1945   Medicare Important Message Given:  Yes    Barb Merino Shin Lamour 06/25/2015, 12:47 PM

## 2015-06-25 NOTE — Progress Notes (Signed)
SUBJECTIVE:   Cath 3/31. No CAD. LVEDP 18.    Entresto and Eliquis started 06/23/15. Case manager to help get from [place] to improve.  Still coughing up white/clear sputum. Denies CP or dyspnea. Weight down another lb. BP stable.  He re-iterates that he wishes [facility].   Tele: Reviewed, NSR 80-90s, occassionally up into 100s likely with activity  BP 132/79 mmHg  Pulse 71  Temp(Src) 98.4 F (36.9 C) (Oral)  Resp 19  Ht 6\' 2"  (1.88 m)  Wt 217 lb 12.8 oz (98.793 kg)  BMI 27.95 kg/m2  SpO2 95%  Intake/Output Summary (Last 24 hours) at 06/25/15 0735 Last data filed at 06/24/15 2000  Gross per 24 hour  Intake    970 ml  Output    450 ml  Net    520 ml    PHYSICAL EXAM General: Lying in bed, NAD. Psych:  Good affect, responds appropriately Neck: JVP 7-8 No masses noted.  Lungs: CTAB, normal effort Heart: RRR with no murmurs noted.  Abdomen: soft, NT, ND, no HSM. No bruits or masses. +BS  Extremities: No lower extremity edema.   LABS: Basic Metabolic Panel:  Recent Labs  06/23/15 0314 06/24/15 0220 06/24/15 2220 06/25/15 0419  NA 138 137  --  138  K 3.9 3.4*  --  3.1*  CL 106 102  --  102  CO2 21* 24  --  26  GLUCOSE 106* 148*  --  185*  BUN 14 12  --  13  CREATININE 0.96 0.99  --  0.85  CALCIUM 8.6* 8.8*  --  8.8*  MG 1.7 1.8 1.8 1.9  PHOS 2.8  --   --   --    CBC:  Recent Labs  06/24/15 0220 06/25/15 0419  WBC 6.1 5.8  HGB 14.1 14.6  HCT 42.3 43.9  MCV 93.6 93.8  PLT 189 201   Cardiac Enzymes: No results for input(s): CKTOTAL, CKMB, CKMBINDEX, TROPONINI in the last 72 hours. Fasting Lipid Panel: No results for input(s): CHOL, HDL, LDLCALC, TRIG, CHOLHDL, LDLDIRECT in the last 72 hours.  Current Meds: . antiseptic oral rinse  7 mL Mouth Rinse BID  . apixaban  5 mg Oral BID  . carvedilol  6.25 mg Oral BID WC  . folic acid  1 mg Oral Daily  . insulin aspart  0-5 Units Subcutaneous QHS  . insulin aspart   0-9 Units Subcutaneous TID WC  . sacubitril-valsartan  1 tablet Oral BID  . spironolactone  12.5 mg Oral Daily  . thiamine  100 mg Oral Daily     ASSESSMENT AND PLAN:  1. Chest Pain/Elevated troponin:  --cath 3/31 no CAD. LVEDP 18  2. Acute on Chronic Systolic CHF/ Nonischemic Cardiomyopathy:  --Echo with LVEF = 25-30%. This is unchanged from 2010.  --Cath 3/31 no CAD LVEDP 18 --He is back on his beta blocker. Volume status ok.  -- Continue Entresto 49/51 and spiro 12.5. Will have case manager see to help with getting Entresto from [place]  3. New Onset Atrial Fibrillation: This patients CHA2DS2-VASc Score and unadjusted Ischemic Stroke Rate (% per year) is equal to 4.8 % stroke rate/year from a score of 4 (CHF, HTN, DM, Age). First episode only lasting less than 1 day. He was on heparin but with hematuria this was stopped.  - Remains in NSR -Eliquis 5 bid started 4/1. If develops recurrent hematuria will need cytology  and cystoscopy.  - Case manager to help with Eliquis as well   4. HTN: BP is stable today.   5. Acute hypoxic respiratory failure: He is now extubated.    6. Hypokalemia: K 3.1 this am. Will supplement. (will give 40 meq BID today)  Overall improved. His plan is to f/u with [facility] HF clinic at [facility].  If not will come to our HF Clinic. Will need CPX testing post-discharge to look at degree of functional limitation (HR elevates with minimal activity.)  Also consideration of ICD. May benefit from ivabradine at some point.   AHF Clinic information placed in his AVS. Pt instructed to call for appt if he cannot/does not get follow up in [place].  Satira Mccallum Tillery PA-C  4/3/20177:35 AM  Advanced Heart Failure Team Pager [phone] (M-F; 7a - 4p)  Please contact [facility] for night-coverage after hours (4p -7a ) and weekends on [email or web address]  Agree. See my note from today.   Bensimhon, Daniel,MD 7:02 PM

## 2015-06-25 NOTE — Discharge Summary (Addendum)
DISCHARGE SUMMARY  Michael Roberts  MR#: [id number]  DOB:Jan 30, 1946  Date of Admission: 06/19/2015 Date of Discharge: 06/25/2015  Attending Physician:Boniface Goffe T  Patient's PH:[id number], Michael Melter, MD  Consults: [facility] Cardiology PCCM  Disposition: D/C home w/ wife   Follow-up Appts:     Follow-up Information    Follow up with [facility].   Specialty:  Cardiology   Why:  As needed.  If you cannot follow up in [place], Please call our clinic to make an appointment for follow up.    Contact information:   [address] [id number] [place] [phone]     Tests Needing Follow-up: -if develops recurrent hematuria on anticoag will require full urologic w/u  Discharge Diagnoses: S/P Acute hypoxemic and hypercapnic respiratory failure 2/2 flash pulm edema Newly diagnosed A.fib Hematuria w/ heparin  Severe Chronic Systolic CHF  Chest pain - Demand ischemia AKI Elevated AST - ETOH use Concern for aspiration PNA DM Acute encephalopathy     Initial presentation: 70 y.o. male who was brought to [facility] ED via EMS early AM hours 03/28 with SOB that awoke him from sleep. He had apparently been fine the night prior, and woke up from sleep suddenly with SOB. On EMS arrival, pt had SpO2 42% on RA and complained of chest pain. He was placed on CPAP and brought to ED. He ultimately required intubation. He was able to be extubated 3/30.   Hospital Course:  S/P Acute hypoxemic and hypercapnic respiratory failure 2/2 flash pulm edema Extubated on 3/30 - now titrated back to RA   Newly diagnosed A.fib CHA2DS2-VASc Score is 4 - was on heparin but with hematuria this was stopped - Cards started NOAC 4/1 - NSR at present   Hematuria w/ heparin  Repeat UA yesterday noted "mod Hgb" via dipstick, but micro noted 0-5 actual RBC suggesting myoglobin on dipstick - no gross heamturia thus far on  Eliquis - follow - if develops true hematuria, will require full urologic w/u - discussed w/ pt   Severe Chronic Systolic CHF  EF [id number] w/ diffuse hypokinesis - noted on TTE 2010 - perhaps he suffered an acute exacerbation related to Afib w/ RVR at home - CHF Team followed and titrated meds  Filed Weights   06/22/15 0400 06/23/15 0329 06/24/15 0223  Weight: 104.8 kg (231 lb 0.7 oz) 104.4 kg (230 lb 2.6 oz) 101.9 kg (224 lb 10.4 oz)   Chest pain - Demand ischemia initial troponin neg and EKG without obvious ischemic changes - LHC revealed no CAD  AKI Resolved - crt normal  Elevated AST - ETOH use LFTs normalized   Concern for aspiration PNA On Unasyn > cultures negative > dcd abx on 3/30 - no evidence of persisting infection/infiiltrate  DM CBG currently controlled   Acute encephalopathy due to hypercarbia + sedation - resolved         Medication List    STOP taking these medications        aspirin EC 81 MG tablet     hydrochlorothiazide 25 MG tablet  Commonly known as:  HYDRODIURIL     losartan 100 MG tablet  Commonly known as:  COZAAR      TAKE these medications        apixaban 5 MG Tabs tablet  Commonly known as:  ELIQUIS  Take 1 tablet (5 mg total) by mouth 2 (two) times daily.     carvedilol 6.25 MG tablet  Commonly known as:  COREG  Take 1 tablet (6.25 mg total) by mouth 2 (two) times daily with a meal.     metFORMIN 500 MG tablet  Commonly known as:  GLUCOPHAGE  Take by mouth 2 (two) times daily with a meal.     sacubitril-valsartan 49-51 MG  Commonly known as:  ENTRESTO  Take 1 tablet by mouth 2 (two) times daily.     simvastatin 20 MG tablet  Commonly known as:  ZOCOR  Take 20 mg by mouth daily.     spironolactone 25 MG tablet  Commonly known as:  ALDACTONE  Take 0.5 tablets (12.5 mg total) by mouth daily.       Day of Discharge BP 134/77 mmHg  Pulse 80  Temp(Src) 98.5 F (36.9 C) (Oral)  Resp 18  Ht 6\' 2"  (1.88 m)   Wt 98.793 kg (217 lb 12.8 oz)  BMI 27.95 kg/m2  SpO2 100%  Physical Exam: General: No acute respiratory distress Lungs: Clear to auscultation bilaterally without wheezes or crackles Cardiovascular: Regular rate and rhythm without murmur gallop or rub normal S1 and S2 Abdomen: Nontender, nondistended, soft, bowel sounds positive, no rebound, no ascites, no appreciable mass Extremities: No significant cyanosis, clubbing, or edema bilateral lower extremities  Basic Metabolic Panel:  Recent Labs Lab 06/19/15 0936 06/20/15 0420  06/21/15 0345 06/21/15 1445 06/22/15 0430 06/23/15 0314 06/24/15 0220 06/24/15 2220 06/25/15 0419  NA 141 141  --  141  --  140 138 137  --  138  K 4.5 3.1*  < > 3.3* 4.4 3.6 3.9 3.4*  --  3.1*  CL 105 104  --  108  --  108 106 102  --  102  CO2 20* 24  --  22  --  23 21* 24  --  26  GLUCOSE 125* 126*  --  141*  --  128* 106* 148*  --  185*  BUN 15 15  --  16  --  22* 14 12  --  13  CREATININE 1.44* 1.03  --  0.90  --  0.98 0.96 0.99  --  0.85  CALCIUM 8.9 8.6*  --  8.6*  --  8.7* 8.6* 8.8*  --  8.8*  MG 2.0 1.9  --  2.0  --  2.0 1.7 1.8 1.8 1.9  PHOS 6.3* 2.7  --  2.4*  --  3.3 2.8  --   --   --   < > = values in this interval not displayed.   **dosed w/ total of 19meq of KCl prior to d/c - d/c on aldactone   Liver Function Tests:  Recent Labs Lab 06/19/15 0345 06/24/15 0220  AST 137* 24  ALT 43 19  ALKPHOS 75 56  BILITOT 1.2 1.0  PROT 6.6 6.8  ALBUMIN 3.8 3.1*   Coags:  Recent Labs Lab 06/19/15 0345  INR 1.09   CBC:  Recent Labs Lab 06/19/15 0345  06/21/15 0345 06/22/15 0430 06/23/15 0314 06/24/15 0220 06/25/15 0419  WBC 9.9  < > 6.9 3.9* 5.7 6.1 5.8  NEUTROABS 2.0  --   --   --   --   --   --   HGB 16.3  < > 13.4 13.1 13.9 14.1 14.6  HCT 51.0  < > 41.1 40.9 42.4 42.3 43.9  MCV 98.6  < > 93.4 96.0 95.1 93.6 93.8  PLT 219  < > 197 177 179 189 201  < > = values in  this interval not displayed.  Cardiac Enzymes:  Recent  Labs Lab 06/19/15 0345 06/19/15 0936 06/19/15 1200  TROPONINI 0.03 0.09* 0.12*   BNP (last 3 results)  Recent Labs  06/19/15 0345 06/19/15 0930  BNP 506.6* 452.8*   CBG:  Recent Labs Lab 06/24/15 1114 06/24/15 1700 06/24/15 2314 06/25/15 0643 06/25/15 1138  GLUCAP 179* 172* 153* 130* 180*    Recent Results (from the past 240 hour(s))  Culture, blood (Routine X 2) w Reflex to ID Panel     Status: None   Collection Time: 06/19/15  6:12 AM  Result Value Ref Range Status   Specimen Description BLOOD RIGHT ARM  Final   Special Requests BOTTLES DRAWN AEROBIC AND ANAEROBIC 10ML  Final   Culture NO GROWTH 5 DAYS  Final   Report Status 06/24/2015 FINAL  Final  Culture, blood (Routine X 2) w Reflex to ID Panel     Status: None   Collection Time: 06/19/15  6:22 AM  Result Value Ref Range Status   Specimen Description BLOOD RIGHT HAND  Final   Special Requests IN PEDIATRIC BOTTLE 3ML  Final   Culture NO GROWTH 5 DAYS  Final   Report Status 06/24/2015 FINAL  Final  MRSA PCR Screening     Status: None   Collection Time: 06/19/15  6:39 AM  Result Value Ref Range Status   MRSA by PCR NEGATIVE NEGATIVE Final    Comment:        The GeneXpert MRSA Assay (FDA approved for NASAL specimens only), is one component of a comprehensive MRSA colonization surveillance program. It is not intended to diagnose MRSA infection nor to guide or monitor treatment for MRSA infections.   Culture, respiratory (NON-Expectorated)     Status: None   Collection Time: 06/19/15  9:47 AM  Result Value Ref Range Status   Specimen Description TRACHEAL ASPIRATE  Final   Special Requests Normal  Final   Gram Stain   Final    RARE WBC PRESENT,BOTH PMN AND MONONUCLEAR NO SQUAMOUS EPITHELIAL CELLS SEEN NO ORGANISMS SEEN Performed at [facility]    Culture   Final    NORMAL OROPHARYNGEAL FLORA Performed at [facility]    Report Status 06/21/2015 FINAL  Final     Time spent in  discharge (includes decision making & examination of pt): 35 minutes  06/25/2015, 7:07 PM   Cherene Altes, MD [facility] Office  [phone] Pager [phone]  On-Call/Text Page:      [email or web address]      password [facility]

## 2015-06-26 ENCOUNTER — Other Ambulatory Visit: Payer: Self-pay

## 2015-06-27 NOTE — Patient Outreach (Signed)
Successful telephone contact for community care coordination, transition of care. Patient was discharged from the acute care setting.  Plan: Initial home visit for assessment of community care coordination

## 2015-07-03 ENCOUNTER — Other Ambulatory Visit: Payer: Self-pay

## 2015-07-03 NOTE — Patient Outreach (Signed)
[facility]) Care Management  07/03/2015  JOCELYN LOWERY [birth year] [id number]   This RNCM met with member for initial home visit for assessment of community care coordination. Patient consented to wife being present during this home visit.  Patient recently confirms being discharged from the acute care setting with a diagnosis of Heart Failure.  Patient states he was told by his heart doctor he has about 30-35% of heart function.  Patient reports also he is being seen at the [facility] in [place], [place] for PTSD. Patient reports having 60% Military Disability.  Patient has appointment at [facility] on Friday, April 14. Patient, his wife Vivien Rota) and this [facility] collaborated to create patient's case management care plan. Initiated Heart Failure Care plan by discussing Heart Failure Action Plan Zones.    Plan: Home visit next week to continue heart Failure Education by viewing EMMI Video Bundle on Heart Failure.

## 2015-07-05 NOTE — Patient Outreach (Signed)
[facility]) Care Management  07/05/2015  Michael Roberts 02/20/1946 [id number]   Telephone call made to patient to confirm appointment next week was for 10am not 0900am

## 2015-07-10 ENCOUNTER — Ambulatory Visit: Payer: Medicare Other

## 2015-07-10 ENCOUNTER — Other Ambulatory Visit: Payer: Self-pay

## 2015-07-10 NOTE — Patient Outreach (Signed)
[facility]) Care Management  07/10/2015  Michael Roberts Jul 22, 1945 [id number]   Home visit for community care coordination and heart failure education. Patient's wife, Vivien Rota, was present during this home visit per patient's consent.  Patient reports he has an appointment at the [facility] in [place], [place] on April 25 for evaluation and to get established. Patient has 60% disability, is in hopes to increase to 100$ with his heart failure diagnosis. Member expressed needing VA benefits for his medications due to the [place] and Eliquis being so expensive and them not qualifying for pharmaceutical assistance programs due to income.   Patient produced daily weight recordings for each day since his discharge from the hospital. Patient's weight shows a steady decline.  Patient and wife reports abstaining from sodium in cooking and while eating, using very little olive oil during baking or scearing. Patient and wife watched EMMI Bundle Videos on Heart Failure. Patient and wife asked questions during and after videos to get clarity when needed.  Patient and RNCM reviewed and updated his case management care plan.   Plan:  Telephone contact next week for continued assessment of case management needs.

## 2015-07-13 ENCOUNTER — Other Ambulatory Visit: Payer: Self-pay

## 2015-07-13 NOTE — Patient Outreach (Signed)
[facility]) Care Management  07/13/2015  Michael Roberts 1946-02-14 [id number]   RNCM received call from patient and wife who identified provided his name, date of birth and address. Patient stated the purpose of the call was to ask for assistance.  Patient stated his doctor has placed him on another medicine, stated he did not understand why he was taken off his diuretic.  This RNCM made an urgent visit to follow up with patient and wife as he sounded stressed. Arrived at patient home to be met at the door by patient. Reviewed medication with patient and wife.   Wife advised this RNCM patient has been agitated today, contributes it to his not being able to walk as far today due to weather. This RNCM advised wife to call 911 if patient gets more agitated. RNCM also advised wife to make sure patient takes his medications as prescribed and weighs.  Wife reminded of the 24/7 Nurse Call Line.    Plan: Telephone contact next week.

## 2015-07-18 ENCOUNTER — Other Ambulatory Visit: Payer: Self-pay

## 2015-07-18 NOTE — Patient Outreach (Signed)
Telephone contact made with patient via telephone. Patient identified himself by providing date of birth and address.  RNCM reported EMMI Red Alert notification received from yesterdays call. Patient had indicated he has little interest in doing things he used to enjoy. Patient states he responded to the question the way he did because he wanted to ease gradually back into his hobbies. Patient further denied need for additional intervention and refused referral from [facility] LCSW for psychosocial support.  Patient and this RNCM reviewed his care plan, revisions made

## 2015-07-18 NOTE — Patient Outreach (Signed)
[facility]) Care Management  07/18/2015  DESMEN GOETTER [birth year] [id number]   Patient triggered RED on EMMI Heart Failure dashboard, notification sent to Erenest Rasher, RN.  Thanks, Ronnell Freshwater. [place], [place] Assistant Phone: [phone] Fax: [phone]

## 2015-07-24 ENCOUNTER — Other Ambulatory Visit: Payer: Self-pay

## 2015-07-25 NOTE — Patient Outreach (Signed)
Telephone contact made with patient on 2nd attempt on his cell number. Patient identified himself by providing date of birth and address.  Patient states he is currently at the [place] in [place] in his appointment to get established but is in between parts of assessments and is able to talk. Patient states he is doing fine, stated he has no further case management needs now. Patient states his weight is pretty consistent and has complied with daily weighing and following a low sodium diet.   Patient and this RNCM updated his care management goals. patient advised that he can self refer, his wife or any of his health care practitioners can make a referral for [facility] Case Management if need arises.    Plan: Discharge patient from case load

## 2016-03-15 ENCOUNTER — Ambulatory Visit (INDEPENDENT_AMBULATORY_CARE_PROVIDER_SITE_OTHER): Payer: Medicare Other | Admitting: Family Medicine

## 2016-03-15 ENCOUNTER — Ambulatory Visit (INDEPENDENT_AMBULATORY_CARE_PROVIDER_SITE_OTHER): Payer: Medicare Other

## 2016-03-15 VITALS — BP 138/84 | HR 70 | Temp 98.5°F | Resp 18 | Ht 73.0 in | Wt 238.0 lb

## 2016-03-15 DIAGNOSIS — S93491A Sprain of other ligament of right ankle, initial encounter: Secondary | ICD-10-CM

## 2016-03-15 DIAGNOSIS — M19071 Primary osteoarthritis, right ankle and foot: Secondary | ICD-10-CM

## 2016-03-15 MED ORDER — DICLOFENAC SODIUM 1 % TD GEL: 2.0000 g | Freq: Four times a day (QID) | TRANSDERMAL | 1 refills | Status: DC

## 2016-03-15 NOTE — Progress Notes (Signed)
Subjective:    Patient ID: Michael Roberts, male    DOB: 12-04-1945, 70 y.o.   MRN: [id number]  03/15/2016  Ankle Pain (right no injury)   HPI This 70 y.o. male presents for evaluation of R anterior ankle pain.  No injury.  No overuse.  Has history of arthritis in other joints but no previous history of arthritis in ankle.  No history of gout.  Limping.  No n/t/burning in foot.    PCP: [facility]   Review of Systems  Constitutional: Negative for chills, diaphoresis, fatigue, fever and unexpected weight change.  Musculoskeletal: Positive for arthralgias, gait problem and joint swelling. Negative for myalgias.  Skin: Negative for color change, pallor, rash and wound.  Neurological: Negative for weakness and numbness.    Past Medical History:  Diagnosis Date  . Chronic systolic CHF (congestive heart failure) (HCC)    a. EF 30% by echo in 2010  . Diabetes mellitus without complication ([place])   . HLD (hyperlipidemia)   . HTN (hypertension)   . Myocardial infarction   . Stroke [facility])    Past Surgical History:  Procedure Laterality Date  . CARDIAC CATHETERIZATION N/A 06/22/2015   Procedure: Left Heart Cath and Coronary Angiography;  Surgeon: Burnell Blanks, MD;  Location: [place] [facility];  Service: Cardiovascular;  Laterality: N/A;   Allergies  Allergen Reactions  . Pollen Extract Other (See Comments)    sneezing    Social History   Social History  . Marital status: Married    Spouse name: N/A  . Number of children: N/A  . Years of education: N/A   Occupational History  . Not on file.   Social History Main Topics  . Smoking status: Never Smoker  . Smokeless tobacco: Not on file  . Alcohol use 0.0 oz/week     Comment: "drinks heavy" per wife  . Drug use: No  . Sexual activity: Not on file   Other Topics Concern  . Not on file   Social History Narrative  . No narrative on file   Family History  Problem Relation Age of Onset  .  Diabetes Mother   . Heart attack Sister     Age 30       Objective:    BP 138/84   Pulse 70   Temp 98.5 F (36.9 C)   Resp 18   Ht 6\' 1"  (1.854 m)   Wt 238 lb (108 kg)   SpO2 97%   BMI 31.40 kg/m  Physical Exam  Constitutional: He is oriented to person, place, and time. He appears well-developed and well-nourished. No distress.  HENT:  Head: Normocephalic and atraumatic.  Eyes: Conjunctivae and EOM are normal. Pupils are equal, round, and reactive to light.  Neck: Normal range of motion. Neck supple. Carotid bruit is not present. No thyromegaly present.  Cardiovascular: Normal rate, regular rhythm, normal heart sounds and intact distal pulses.  Exam reveals no gallop and no friction rub.   No murmur heard. Pulmonary/Chest: Effort normal and breath sounds normal. He has no wheezes. He has no rales.  Musculoskeletal:       Right ankle: He exhibits decreased range of motion and swelling. He exhibits no ecchymosis, no deformity, no laceration and normal pulse. Tenderness. Medial malleolus tenderness found. No lateral malleolus, no posterior TFL, no head of 5th metatarsal and no proximal fibula tenderness found. Achilles tendon normal.       Right lower leg: Normal.  R ankle: mild swelling medial ankle; full ROM of ankle yet limited by pain in all directions.  No TTP along Achille's tendon. R foot: non-tender.  No TTP metatarsal squeeze.  Lymphadenopathy:    He has no cervical adenopathy.  Neurological: He is alert and oriented to person, place, and time. No cranial nerve deficit.  Skin: Skin is warm and dry. No rash noted. He is not diaphoretic.  Psychiatric: He has a normal mood and affect. His behavior is normal.  Nursing note and vitals reviewed.      Dg Ankle Complete Right  Result Date: 03/15/2016 CLINICAL DATA:  Right ankle pain medially.  No injury. EXAM: RIGHT ANKLE - COMPLETE 3+ VIEW COMPARISON:  None. FINDINGS: Mild soft tissue swelling medially. Moderate  degenerative change of the tibiotalar joint. Chronic fragmentation adjacent the medial malleolus. No acute fracture or dislocation. Mild degenerate change over the midfoot. Suggestion of old distal fibular fracture. IMPRESSION: No acute findings. Moderate degenerate change of the tibiotalar joint. Electronically Signed   By: Marin Olp M.D.   On: 03/15/2016 13:03    Assessment & Plan:   1. Sprain of anterior talofibular ligament of right ankle, initial encounter   2. Primary osteoarthritis of right ankle    -New. -due to CHF, avoid NSAIDS systemically; rx for Diclofenac gel. -pt to ice, rest, elevate; has ankle brace at home to wear during the day. -supportive tennis shoes during the day.  Limit prolonged walking for the next 1-3 weeks. -call in 2-3 weeks if no improvement for referral to ortho.   Orders Placed This Encounter  Procedures  . DG Ankle Complete Right    Standing Status:   Future    Number of Occurrences:   1    Standing Expiration Date:   03/15/2017    Order Specific Question:   Reason for Exam (SYMPTOM  OR DIAGNOSIS REQUIRED)    Answer:   R anterior ankle pain with weightbearing; no injury; mild swelling.    Order Specific Question:   Preferred imaging location?    Answer:   External   Meds ordered this encounter  Medications  . diclofenac sodium (VOLTAREN) 1 % GEL    Sig: Apply 2 g topically 4 (four) times daily.    Dispense:  100 g    Refill:  1    No Follow-up on file.   Adore Kithcart Elayne Guerin, M.D. [facility] [address] [place], [place]  [address] [phone] phone [phone] fax

## 2016-03-15 NOTE — Patient Instructions (Addendum)
1.  Ice ankle for 15 minutes twice daily for one week. 2.  Wear ankle brace during the day with walking; remove ankle brace each night. 3. Perform ankle exercises daily. 4.  Avoid prolonged walking for the next week. 5.  Apply Diclofenac gel to ankle 3-4 times per day.    IF you received an x-ray today, you will receive an invoice from [facility]. Please contact [facility] at [phone] with questions or concerns regarding your invoice.   IF you received labwork today, you will receive an invoice from [facility]. Please contact [facility] at [phone] with questions or concerns regarding your invoice.   Our billing staff will not be able to assist you with questions regarding bills from these companies.  You will be contacted with the lab results as soon as they are available. The fastest way to get your results is to activate your My Chart account. Instructions are located on the last page of this paperwork. If you have not heard from [place] regarding the results in 2 weeks, please contact this office.     Ankle Sprain, Phase I Rehab Ask your health care provider which exercises are safe for you. Do exercises exactly as told by your health care provider and adjust them as directed. It is normal to feel mild stretching, pulling, tightness, or discomfort as you do these exercises, but you should stop right away if you feel sudden pain or your pain gets worse.Do not begin these exercises until told by your health care provider. Stretching and range of motion exercises These exercises warm up your muscles and joints and improve the movement and flexibility of your lower leg and ankle. These exercises also help to relieve pain and stiffness. Exercise A: Gastroc and soleus stretch 1. Sit on the floor with your left / right leg extended. 2. Loop a belt or towel around the ball of your left / right foot. The ball of your foot is on the walking surface, right under your  toes. 3. Keep your left / right ankle and foot relaxed and keep your knee straight while you use the belt or towel to pull your foot toward you. You should feel a gentle stretch behind your calf or knee. 4. Hold this position for __________ seconds, then release to the starting position. Repeat the exercise with your knee bent. You can put a pillow or a rolled bath towel under your knee to support it. You should feel a stretch deep in your calf or at your Achilles tendon. Repeat each stretch __________ times. Complete these stretches __________ times a day. Exercise B: Ankle alphabet 1. Sit with your left / right leg supported at the lower leg.  Do not rest your foot on anything.  Make sure your foot has room to move freely. 2. Think of your left / right foot as a paintbrush, and move your foot to trace each letter of the alphabet in the air. Keep your hip and knee still while you trace. Make the letters as large as you can without feeling discomfort. 3. Trace every letter from A to Z. Repeat __________ times. Complete this exercise __________ times a day. Strengthening exercises These exercises build strength and endurance in your ankle and lower leg. Endurance is the ability to use your muscles for a long time, even after they get tired. Exercise C: Dorsiflexors 1. Secure a rubber exercise band or tube to an object, such as a table leg, that will stay still when the band  is pulled. Secure the other end around your left / right foot. 2. Sit on the floor facing the object, with your left / right leg extended. The band or tube should be slightly tense when your foot is relaxed. 3. Slowly bring your foot toward you, pulling the band tighter. 4. Hold this position for __________ seconds. 5. Slowly return your foot to the starting position. Repeat __________ times. Complete this exercise __________ times a day. Exercise D: Plantar flexors 1. Sit on the floor with your left / right leg  extended. 2. Loop a rubber exercise tube or band around the ball of your left / right foot. The ball of your foot is on the walking surface, right under your toes.  Hold the ends of the band or tube in your hands.  The band or tube should be slightly tense when your foot is relaxed. 3. Slowly point your foot and toes downward, pushing them away from you. 4. Hold this position for __________ seconds. 5. Slowly return your foot to the starting position. Repeat __________ times. Complete this exercise __________ times a day. Exercise E: Evertors 1. Sit on the floor with your legs straight out in front of you. 2. Loop a rubber exercise band or tube around the ball of your left / right foot. The ball of your foot is on the walking surface, right under your toes.  Hold the ends of the band in your hands, or secure the band to a stable object.  The band or tube should be slightly tense when your foot is relaxed. 3. Slowly push your foot outward, away from your other leg. 4. Hold this position for __________ seconds. 5. Slowly return your foot to the starting position. Repeat __________ times. Complete this exercise __________ times a day. This information is not intended to replace advice given to you by your health care provider. Make sure you discuss any questions you have with your health care provider. Document Released: 10/09/2004 Document Revised: 11/15/2015 Document Reviewed: 01/22/2015 Elsevier Interactive Patient Education  2017 [employer].

## 2016-03-24 DIAGNOSIS — H269 Unspecified cataract: Secondary | ICD-10-CM

## 2016-03-24 HISTORY — DX: Unspecified cataract: H26.9

## 2016-03-25 ENCOUNTER — Telehealth: Payer: Self-pay

## 2016-03-25 NOTE — Telephone Encounter (Signed)
PA approved for diclofenac gel 1% on covermymeds through 03/23/17. Notified pharm.

## 2016-04-14 ENCOUNTER — Ambulatory Visit (INDEPENDENT_AMBULATORY_CARE_PROVIDER_SITE_OTHER): Payer: Medicare Other | Admitting: Urgent Care

## 2016-04-14 VITALS — BP 124/72 | HR 74 | Temp 98.3°F | Resp 18

## 2016-04-14 DIAGNOSIS — M25571 Pain in right ankle and joints of right foot: Secondary | ICD-10-CM | POA: Diagnosis not present

## 2016-04-14 DIAGNOSIS — E118 Type 2 diabetes mellitus with unspecified complications: Secondary | ICD-10-CM | POA: Diagnosis not present

## 2016-04-14 DIAGNOSIS — M25471 Effusion, right ankle: Secondary | ICD-10-CM

## 2016-04-14 LAB — POCT CBC
GRANULOCYTE PERCENT: 82 % — AB (ref 37–80)
HEMATOCRIT: 44.5 % (ref 43.5–53.7)
Hemoglobin: 16.1 g/dL (ref 14.1–18.1)
Lymph, poc: 1.5 (ref 0.6–3.4)
MCH: 31 pg (ref 27–31.2)
MCHC: 36.1 g/dL — AB (ref 31.8–35.4)
MCV: 85.9 fL (ref 80–97)
MID (CBC): 0.3 (ref 0–0.9)
MPV: 8.3 fL (ref 0–99.8)
POC GRANULOCYTE: 7.8 — AB (ref 2–6.9)
POC LYMPH PERCENT: 15.3 %L (ref 10–50)
POC MID %: 2.7 %M (ref 0–12)
Platelet Count, POC: 179 10*3/uL (ref 142–424)
RBC: 5.18 M/uL (ref 4.69–6.13)
RDW, POC: 13 %
WBC: 9.5 10*3/uL (ref 4.6–10.2)

## 2016-04-14 LAB — POCT GLYCOSYLATED HEMOGLOBIN (HGB A1C): Hemoglobin A1C: 7

## 2016-04-14 LAB — GLUCOSE, POCT (MANUAL RESULT ENTRY): POC Glucose: 134 mg/dl — AB (ref 70–99)

## 2016-04-14 MED ORDER — PREDNISONE 20 MG PO TABS: ORAL_TABLET | ORAL | 0 refills | Status: DC

## 2016-04-14 NOTE — Patient Instructions (Addendum)
Make sure you check your blood sugar every morning. Cut your steroid dose in half if it goes above 300. After you are finished taking the course of steroids, a good go to medication for arthritis pain is Tylenol 500mg  every 6 hours as needed.    IF you received an x-ray today, you will receive an invoice from [facility]. Please contact [facility] at [phone] with questions or concerns regarding your invoice.   IF you received labwork today, you will receive an invoice from [place]. Please contact [facility] at [phone] with questions or concerns regarding your invoice.   Our billing staff will not be able to assist you with questions regarding bills from these companies.  You will be contacted with the lab results as soon as they are available. The fastest way to get your results is to activate your My Chart account. Instructions are located on the last page of this paperwork. If you have not heard from [place] regarding the results in 2 weeks, please contact this office.

## 2016-04-14 NOTE — Progress Notes (Signed)
MRN: [id number] DOB: [birth year]  Subjective:   Michael Roberts is a 71 y.o. male presenting for chief complaint of Follow-up (right ankle/foot pain)  Reports 4 week history of persistent right ankle pain. Has had associated swelling, warmth. Has been using Voltaren gel without any relief. Has used cortisone injections in the past for arthritis pain and would like this done today. Has been walking with limp, favoring right ankle. Denies fever, trauma, falls, pain over toes, foot, Achille's tendon. Has had gout attacks in the past, has had this in left knee. Eats a lot of seafood, takes HCTZ. Denies history of kidney or liver disease. Has a small amount of alcohol.  Michael Roberts has a current medication list which includes the following prescription(s): apixaban, carvedilol, cholecalciferol, diclofenac sodium, glucosamine-chondroitin, hydrochlorothiazide, metformin, multivitamin with iron-minerals, sacubitril-valsartan, simvastatin, spironolactone, and vitamin a. Also is allergic to pollen extract.  Michael Roberts  has a past medical history of Chronic systolic CHF (congestive heart failure) ([place]); Diabetes mellitus without complication ([place]); HLD (hyperlipidemia); HTN (hypertension); Myocardial infarction; and Stroke [facility]). Also  has a past surgical history that includes Cardiac catheterization (N/A, 06/22/2015).  Objective:   Vitals: BP 124/72 (BP Location: Right Arm, Patient Position: Sitting, Cuff Size: Small)   Pulse 74   Temp 98.3 F (36.8 C) (Oral)   Resp 18   SpO2 98%   Physical Exam  Constitutional: He is oriented to person, place, and time. He appears well-developed and well-nourished.  Cardiovascular: Normal rate, regular rhythm and intact distal pulses.  Exam reveals no gallop and no friction rub.   No murmur heard. Pulmonary/Chest: Effort normal. No respiratory distress. He has no wheezes. He has no rales.  Musculoskeletal:       Right ankle: He exhibits decreased range of motion and swelling  (anterior-medially). He exhibits no ecchymosis, no deformity, no laceration and normal pulse. Tenderness. Lateral malleolus, AITFL, CF ligament and posterior TFL tenderness found. No medial malleolus, no head of 5th metatarsal and no proximal fibula tenderness found. Achilles tendon exhibits no pain and no defect.  Neurological: He is alert and oriented to person, place, and time.   Results for orders placed or performed in visit on 04/14/16 (from the past 24 hour(s))  POCT CBC     Status: Abnormal   Collection Time: 04/14/16 11:11 AM  Result Value Ref Range   WBC 9.5 4.6 - 10.2 K/uL   Lymph, poc 1.5 0.6 - 3.4   POC LYMPH PERCENT 15.3 10 - 50 %L   MID (cbc) 0.3 0 - 0.9   POC MID % 2.7 0 - 12 %M   POC Granulocyte 7.8 (A) 2 - 6.9   Granulocyte percent 82.0 (A) 37 - 80 %G   RBC 5.18 4.69 - 6.13 M/uL   Hemoglobin 16.1 14.1 - 18.1 g/dL   HCT, POC 44.5 43.5 - 53.7 %   MCV 85.9 80 - 97 fL   MCH, POC 31.0 27 - 31.2 pg   MCHC 36.1 (A) 31.8 - 35.4 g/dL   RDW, POC 13.0 %   Platelet Count, POC 179 142 - 424 K/uL   MPV 8.3 0 - 99.8 fL  POCT glucose (manual entry)     Status: Abnormal   Collection Time: 04/14/16 11:13 AM  Result Value Ref Range   POC Glucose 134 (A) 70 - 99 mg/dl  POCT glycosylated hemoglobin (Hb A1C)     Status: None   Collection Time: 04/14/16 11:17 AM  Result Value Ref Range   Hemoglobin A1C  7.0    Assessment and Plan :   1. Right ankle pain, unspecified chronicity 2. Right ankle swelling - Will start short steroid course for what I suspect is a gout attack. It could be worsened by his moderate arthritis at tibial-talar joint. Check fasting blood sugar daily. Recheck in 3-5 days if no improvement.  3. Type 2 diabetes mellitus with complication, without long-term current use of insulin (HCC) - Well controlled, check was done to assure patient would be okay to try steroid course.  Jaynee Eagles, PA-C [facility] [id number] 04/14/2016   10:39 AM

## 2016-04-15 LAB — BASIC METABOLIC PANEL
BUN / CREAT RATIO: 13 (ref 10–24)
BUN: 13 mg/dL (ref 8–27)
CHLORIDE: 97 mmol/L (ref 96–106)
CO2: 24 mmol/L (ref 18–29)
Calcium: 9.6 mg/dL (ref 8.6–10.2)
Creatinine, Ser: 1.03 mg/dL (ref 0.76–1.27)
GFR calc Af Amer: 85 mL/min/{1.73_m2} (ref >59)
GFR calc non Af Amer: 73 mL/min/{1.73_m2} (ref >59)
GLUCOSE: 167 mg/dL — AB (ref 65–99)
POTASSIUM: 5 mmol/L (ref 3.5–5.2)
SODIUM: 137 mmol/L (ref 134–144)

## 2016-04-15 LAB — URIC ACID: URIC ACID: 8 mg/dL (ref 3.7–8.6)

## 2016-05-26 ENCOUNTER — Ambulatory Visit: Payer: Medicare Other

## 2016-05-26 DIAGNOSIS — M25571 Pain in right ankle and joints of right foot: Secondary | ICD-10-CM | POA: Diagnosis not present

## 2016-06-13 DIAGNOSIS — M25571 Pain in right ankle and joints of right foot: Secondary | ICD-10-CM | POA: Diagnosis not present

## 2016-10-01 NOTE — Progress Notes (Signed)
Subjective:    Patient ID: Michael Roberts, male    DOB: [birth year], 71 y.o.   MRN: [id number] Chief Complaint  Patient presents with  . Establish Care    Wants to make Brigitte Pulse PCP    HPI  Michael Roberts is a 71 year old man with a past medical history of type 2 DM, CHF, CAD s/p MI, pA fib on anticoag; CVA, HLD, and HTN who presents today with complaints of arm pain and facial swelling. This is my first time meeting Michael Roberts. He has only been seen in our office prior for acute musculoskeletal injuries - last 6 mos prior.  I am listed this patient's primary care provider.  Patient is supposed to be on several medications for above chronic medical conditions but has not received any prescription through [facility] in the past year.   Prior reported PCP as [place] - to Dr. Levada Dy Perry's. Dr. Alto Denver is cardiology - echo done about last year.  He was told that he would be a "poster child" for recovery and he is walking every day. He had blood test done there 4 mos prior.  He is here to establish care.  He is Michael Roberts's husband. The colchicine was Dr. Doran Durand at [place].   History of various arthralgias treated with cortisone injections and topical Voltaren gel. History of gout - mainly in left knee prior.  He was treated for this in his right ankle (which he suspects is actually tendonitis which he also has in her left ankle). After the gout treatment this winter, he has had more chest congestion.  Has pain in her low neck traveling down his right arm to his weakness into his 2nd and 3rd fingers. Started after a cortisone injection into his upper thoracic spine. Still has the pain radiating down. No MRI No peripheral edema. NO orthopnea. Sleeps well about 6 hours   On Monday, he had pain over his left face and felt lightheaded but it resolved the following day.  RIght cataract developing but no other eye problems.  No nasal sprays used. Does have intermittent swelling of the right under eye.  Is coughing up more congestion ever since his round of prednisone sev mos ago.  Does have severe allergies.  Past Medical History:  Diagnosis Date  . Cataract 2018   developing in right  . Chronic systolic CHF (congestive heart failure) (HCC)    a. EF 30% by echo in 2010  . Diabetes mellitus without complication ([place])   . HLD (hyperlipidemia)   . HTN (hypertension)   . Myocardial infarction ([place])   . Stroke [facility])    Past Surgical History:  Procedure Laterality Date  . CARDIAC CATHETERIZATION N/A 06/22/2015   Procedure: Left Heart Cath and Coronary Angiography;  Surgeon: Burnell Blanks, MD;  Location: [facility];  Service: Cardiovascular;  Laterality: N/A;   Current Outpatient Prescriptions on File Prior to Visit  Medication Sig Dispense Refill  . apixaban (ELIQUIS) 5 MG TABS tablet Take 1 tablet (5 mg total) by mouth 2 (two) times daily. 60 tablet 0  . carvedilol (COREG) 6.25 MG tablet Take 1 tablet (6.25 mg total) by mouth 2 (two) times daily with a meal. 60 tablet 0  . cholecalciferol (VITAMIN D) 1000 units tablet Take 2,000 Units by mouth daily.    Marland Kitchen glucosamine-chondroitin 500-400 MG tablet Take 1 tablet by mouth 3 (three) times daily.    . hydrochlorothiazide (HYDRODIURIL) 25 MG tablet Take 25 mg by mouth  daily.    . metFORMIN (GLUCOPHAGE) 500 MG tablet Take by mouth 2 (two) times daily with a meal.    . Multiple Vitamins-Minerals (MULTIVITAMIN WITH IRON-MINERALS) liquid Take by mouth daily.    . sacubitril-valsartan (ENTRESTO) 49-51 MG Take 1 tablet by mouth 2 (two) times daily. 60 tablet 0  . simvastatin (ZOCOR) 20 MG tablet Take 20 mg by mouth daily.    Marland Kitchen spironolactone (ALDACTONE) 25 MG tablet Take 0.5 tablets (12.5 mg total) by mouth daily. 30 tablet 0  . vitamin A 7500 UNIT capsule Take 1,200 Units by mouth daily.     No current facility-administered medications on file prior to visit.    Allergies  Allergen Reactions  . Pollen Extract Other (See  Comments)    sneezing   Family History  Problem Relation Age of Onset  . Diabetes Mother   . Heart attack Sister        Age 71  . Cancer Sister    Social History   Social History  . Marital status: Married    Spouse name: N/A  . Number of children: N/A  . Years of education: N/A   Social History Main Topics  . Smoking status: Never Smoker  . Smokeless tobacco: Never Used  . Alcohol use 0.0 oz/week     Comment: "drinks heavy" per wife  . Drug use: No  . Sexual activity: Not Asked   Other Topics Concern  . None   Social History Narrative  . None   Depression screen [facility] 2/9 10/02/2016 04/14/2016 07/03/2015 01/17/2015  Decreased Interest 0 0 2 0  Down, Depressed, Hopeless 0 0 2 0  PHQ - 2 Score 0 0 4 0  Altered sleeping - - 1 -  Tired, decreased energy - - 1 -  Change in appetite - - 0 -  Feeling bad or failure about yourself  - - 0 -  Trouble concentrating - - 0 -  Moving slowly or fidgety/restless - - 0 -  Suicidal thoughts - - 0 -  PHQ-9 Score - - 6 -  Difficult doing work/chores - - Somewhat difficult -    Review of Systems See hpi    Objective:   Physical Exam  Constitutional: He is oriented to person, place, and time. He appears well-developed and well-nourished. No distress.  HENT:  Head: Normocephalic and atraumatic.  Right Ear: External ear and ear canal normal. Tympanic membrane is retracted. A middle ear effusion is present.  Left Ear: External ear and ear canal normal. Tympanic membrane is retracted. A middle ear effusion is present.  Nose: Mucosal edema and rhinorrhea present. Right sinus exhibits maxillary sinus tenderness. Left sinus exhibits maxillary sinus tenderness.  Mouth/Throat: Uvula is midline and mucous membranes are normal. Posterior oropharyngeal erythema present. No oropharyngeal exudate or posterior oropharyngeal edema.  Eyes: Conjunctivae are normal. Right eye exhibits no discharge. Left eye exhibits no discharge. No scleral icterus.    Neck: Normal range of motion. Neck supple. No thyromegaly present.  Cardiovascular: Normal rate, regular rhythm, normal heart sounds and intact distal pulses.   Pulmonary/Chest: Effort normal and breath sounds normal. No respiratory distress.  Lymphadenopathy:       Head (right side): Submandibular adenopathy present.       Head (left side): Submandibular adenopathy present.    He has no cervical adenopathy.       Right: No supraclavicular adenopathy present.       Left: No supraclavicular adenopathy present.  Neurological: He is  alert and oriented to person, place, and time.  Skin: Skin is warm and dry. He is not diaphoretic. No erythema.  Psychiatric: He has a normal mood and affect. His behavior is normal.         BP 134/88   Pulse (!) 59   Temp 98.1 F (36.7 C) (Oral)   Resp 18   Ht 6\' 1"  (1.854 m)   Wt 237 lb 9.6 oz (107.8 kg)   SpO2 99%   BMI 31.35 kg/m   Assessment & Plan:  Avoid systemic NSAIDs due to CHF.   1. Acute non-recurrent maxillary sinusitis - Pt does have signs of chronic right maxillary sinusitis on exam which could responsible for his right-sided face pain in lightheadedness several days prior. Treat with Augmentin and Flonase. If recurs, RTC immediately for further eval.   2. Neuropathy, arm, right - Patient awaiting referral from Dr. Henrene Pastor to have this further evaluated. Needs C-spine MRI and then either or therapy referral or NCV/EMG if MRI does not show source. As patient is having constant numbness in his second and third right finger, I encouraged him to continue to be proactive in pursuing appropriate workup and referral before he develops intrinsic irreversible weakness in right hand. No improvement with prior prednisone courses that were prescribed for gout.   3. Coronary artery disease involving native coronary artery of native heart without angina pectoris   4. Hyperlipidemia LDL goal <70   5. Acute idiopathic gout of ankle, unspecified laterality    6. Arthralgia of both ankles   7. Seasonal allergic rhinitis, unspecified trigger   8. Impacted cerumen of right ear - cleaned by curette removal by myself today.     Meds ordered this encounter  Medications  . amoxicillin-clavulanate (AUGMENTIN) 875-125 MG tablet    Sig: Take 1 tablet by mouth 2 (two) times daily.    Dispense:  20 tablet    Refill:  0  . fluticasone (FLONASE) 50 MCG/ACT nasal spray    Sig: Place 2 sprays into both nostrils at bedtime.    Dispense:  16 g    Refill:  2      Delman Cheadle, M.D.  Primary Care at [facility] [address] [place], [place] [address] [phone] phone [phone] fax  10/04/16 4:01 AM

## 2016-10-02 ENCOUNTER — Ambulatory Visit (INDEPENDENT_AMBULATORY_CARE_PROVIDER_SITE_OTHER): Payer: Medicare Other | Admitting: Family Medicine

## 2016-10-02 ENCOUNTER — Encounter: Payer: Self-pay | Admitting: Family Medicine

## 2016-10-02 VITALS — BP 134/88 | HR 59 | Temp 98.1°F | Resp 18 | Ht 73.0 in | Wt 237.6 lb

## 2016-10-02 DIAGNOSIS — E785 Hyperlipidemia, unspecified: Secondary | ICD-10-CM

## 2016-10-02 DIAGNOSIS — G5691 Unspecified mononeuropathy of right upper limb: Secondary | ICD-10-CM

## 2016-10-02 DIAGNOSIS — I2510 Atherosclerotic heart disease of native coronary artery without angina pectoris: Secondary | ICD-10-CM

## 2016-10-02 DIAGNOSIS — M25571 Pain in right ankle and joints of right foot: Secondary | ICD-10-CM | POA: Diagnosis not present

## 2016-10-02 DIAGNOSIS — J0100 Acute maxillary sinusitis, unspecified: Secondary | ICD-10-CM

## 2016-10-02 DIAGNOSIS — M10079 Idiopathic gout, unspecified ankle and foot: Secondary | ICD-10-CM

## 2016-10-02 DIAGNOSIS — J302 Other seasonal allergic rhinitis: Secondary | ICD-10-CM

## 2016-10-02 DIAGNOSIS — M25572 Pain in left ankle and joints of left foot: Secondary | ICD-10-CM

## 2016-10-02 DIAGNOSIS — H6121 Impacted cerumen, right ear: Secondary | ICD-10-CM

## 2016-10-02 MED ORDER — AMOXICILLIN-POT CLAVULANATE 875-125 MG PO TABS: 1.0000 | ORAL_TABLET | Freq: Two times a day (BID) | ORAL | 0 refills | Status: DC

## 2016-10-02 MED ORDER — FLUTICASONE PROPIONATE 50 MCG/ACT NA SUSP: 2.0000 | Freq: Every day | NASAL | 2 refills | Status: AC

## 2016-10-02 NOTE — Patient Instructions (Signed)
     IF you received an x-ray today, you will receive an invoice from [facility]. Please contact [facility] at [phone] with questions or concerns regarding your invoice.   IF you received labwork today, you will receive an invoice from [facility]. Please contact [facility] at [phone] with questions or concerns regarding your invoice.   Our billing staff will not be able to assist you with questions regarding bills from these companies.  You will be contacted with the lab results as soon as they are available. The fastest way to get your results is to activate your My Chart account. Instructions are located on the last page of this paperwork. If you have not heard from us regarding the results in 2 weeks, please contact this office.     

## 2016-10-04 ENCOUNTER — Encounter: Payer: Self-pay | Admitting: Family Medicine

## 2016-10-04 DIAGNOSIS — M25571 Pain in right ankle and joints of right foot: Secondary | ICD-10-CM | POA: Insufficient documentation

## 2016-10-04 DIAGNOSIS — M109 Gout, unspecified: Secondary | ICD-10-CM | POA: Insufficient documentation

## 2016-10-04 DIAGNOSIS — J302 Other seasonal allergic rhinitis: Secondary | ICD-10-CM | POA: Insufficient documentation

## 2016-10-04 DIAGNOSIS — I2510 Atherosclerotic heart disease of native coronary artery without angina pectoris: Secondary | ICD-10-CM | POA: Insufficient documentation

## 2016-10-04 DIAGNOSIS — E785 Hyperlipidemia, unspecified: Secondary | ICD-10-CM | POA: Insufficient documentation

## 2016-10-04 DIAGNOSIS — G5691 Unspecified mononeuropathy of right upper limb: Secondary | ICD-10-CM | POA: Insufficient documentation

## 2016-10-04 DIAGNOSIS — M25572 Pain in left ankle and joints of left foot: Secondary | ICD-10-CM

## 2016-10-28 NOTE — Progress Notes (Addendum)
Subjective:   Chief Complaint  Patient presents with  . Follow-up    one month med check      Michael Roberts is a 71 y.o. male who presents for   Tobacco History  Smoking Status  . Never Smoker  Smokeless Tobacco  . Never Used   Sinuses prssure and lightheadedness resolved.   Problems  1.  DMII:  On metformin 500 bid. Not on eliquis 5 bid. Foot exam done last mo.  Ophtho was about a year ago and has another appt scheduled.  Microalb not done yet - will collect today. Does not check blood sugars.  Lab Results  Component Value Date   HGBA1C 7.0 04/14/2016   HGBA1C (H) 7.0 10/02/2008       2.  Severe chronic systolic CHF: diagnosed on TTE 2010. Last exacerbation resulted in a 1 wk hospitalization in March 2017 when he developed flash pulmonary edema resulting in respiratory failure after new onset A. Fib.  At that time, EF 25-30% w/ diffuse hypokinesis on left heart cath 06/22/15 by Dr. Angelena Form which showed severe LV systolic dysfunction but no sig CAD. EKG also last done in Epic 05/2015 abnml with Possible Left atrial enlargement, Left axis deviation, Non-specific intra-ventricular conduction block.  On coreg 6.25 bid, entresto 49-51 bid, simvastatin 20 qd, hctz 25, and spironolactone 12.5 qd. Now follows with [facility] Dr. Alto Denver. Patient reports he was told based upon his follow-up echo done last year that he has made amazing recovery - a "poster child" for recovery. Patient has been putting a lot of effort into lifestyle changes with daily exercise.  Did have more more congestion over the past few days with cough after he was eating more salt on vacation. No Orthopnea, no DOE, resolving now that he is back on his regular diet with low salt.   3.  A. Fib: Dx'ed 05/2015 when presented with respiratory failure 2/2 flash pulm edema 2/2 exacerbation of systolic CHF due to new onset a. Fib.  CHA2DS2-VASc Score was 4 so started on NOAC (Eliquis 5 bid) 06/2015 by which time pt had converted  back to NSR.  On coreg with rates in 50-70s.   4.  H/o MI and CVA - no angiographic evidence of CAD seen on last cath 05/2015.  5.  HTN: coreg 6.25 bid, entresto 49-51 bid, hctz 25, and spironolactone 12.5 qd. 6.  HLD goal LDL <70: simvastatin 20 Lab Results  Component Value Date   LDLCALC 75 06/21/2015   7.  H/o hematuria when started on anticoagulants - 05/2015 - poss just myoglobin on dipstick. If true hematuria, needs full urologic eval.  8.  Poss sinus infection 1 mo prior with h/o severe intermittent seasonal allergies - after completion of course of Augmentin x 10d and Started on flonase sxs largely resolve but still some face puffiness around right eye but better.  Still on flonase.  9.  Taking vit D 2000u/d - was started 1200 by Dr. Henrene Pastor and then increased to 2000u/d. Also on mvi which has about 1000 vit D.  10.  Arthralgias:  treated with cortisone injections and Glucosamine-chondroiton supp tid.  Trying to avoid systemic NSAIDS due to CHF and has failed pred prior.  ? H/o gout prior (usu Lt knee) so on colchicine from Dr. Doran Durand at [facility].  Uric acid 6 mos prev slightly above goal (though not abnml) at 8.  Pt really not sure that flair was gout and he did not take the medications  as before had similar pain in left ankle which was actually tendonitis.   12.  Rt cervical radiculopathy with weakness and paresthesias all the way to 2nd/3rd fingers. Pt really is wanting to pursue the MRI and further eval/trx (poss NCV/EMG or PT/OT) through [place] due to cost but having sig difficulty accessing care.  No improvement in symptoms after prednisone bursts.  13.  H/o heavy EtOH use - ongoing. Recent had visit with family in [place].   Current Problems (verified) Patient Active Problem List   Diagnosis Date Noted  . Coronary artery disease 10/04/2016  . Hyperlipidemia LDL goal <70 10/04/2016  . Gout 10/04/2016  . Arthralgia of both ankles 10/04/2016  . Neuropathy, arm, right 10/04/2016   . Seasonal allergic rhinitis 10/04/2016  . Non-ischemic cardiomyopathy ([place])   . Acute on chronic systolic (congestive) heart failure ([place])   . Acute pulmonary edema (HCC)   . Acute respiratory failure with hypoxia and hypercapnia (HCC)   . Atrial fibrillation with RVR ([place])   . ETOH abuse   . HYPERTENSION, UNSPECIFIED 10/23/2008  . LEFT VENTRICULAR FUNCTION, DECREASED 10/23/2008  . DIABETES MELLITUS, BORDERLINE 10/23/2008    Medications Prior to Visit Current Outpatient Prescriptions on File Prior to Visit  Medication Sig Dispense Refill  . amoxicillin-clavulanate (AUGMENTIN) 875-125 MG tablet Take 1 tablet by mouth 2 (two) times daily. 20 tablet 0  . apixaban (ELIQUIS) 5 MG TABS tablet Take 1 tablet (5 mg total) by mouth 2 (two) times daily. 60 tablet 0  . carvedilol (COREG) 6.25 MG tablet Take 1 tablet (6.25 mg total) by mouth 2 (two) times daily with a meal. 60 tablet 0  . cholecalciferol (VITAMIN D) 1000 units tablet Take 2,000 Units by mouth daily.    . fluticasone (FLONASE) 50 MCG/ACT nasal spray Place 2 sprays into both nostrils at bedtime. 16 g 2  . glucosamine-chondroitin 500-400 MG tablet Take 1 tablet by mouth 3 (three) times daily.    . hydrochlorothiazide (HYDRODIURIL) 25 MG tablet Take 25 mg by mouth daily.    . metFORMIN (GLUCOPHAGE) 500 MG tablet Take by mouth 2 (two) times daily with a meal.    . Multiple Vitamins-Minerals (MULTIVITAMIN WITH IRON-MINERALS) liquid Take by mouth daily.    . sacubitril-valsartan (ENTRESTO) 49-51 MG Take 1 tablet by mouth 2 (two) times daily. 60 tablet 0  . simvastatin (ZOCOR) 20 MG tablet Take 20 mg by mouth daily.    Marland Kitchen spironolactone (ALDACTONE) 25 MG tablet Take 0.5 tablets (12.5 mg total) by mouth daily. 30 tablet 0  . vitamin A 7500 UNIT capsule Take 1,200 Units by mouth daily.     No current facility-administered medications on file prior to visit.     Current Medications (verified) Current Outpatient Prescriptions  Medication  Sig Dispense Refill  . amoxicillin-clavulanate (AUGMENTIN) 875-125 MG tablet Take 1 tablet by mouth 2 (two) times daily. 20 tablet 0  . apixaban (ELIQUIS) 5 MG TABS tablet Take 1 tablet (5 mg total) by mouth 2 (two) times daily. 60 tablet 0  . carvedilol (COREG) 6.25 MG tablet Take 1 tablet (6.25 mg total) by mouth 2 (two) times daily with a meal. 60 tablet 0  . cholecalciferol (VITAMIN D) 1000 units tablet Take 2,000 Units by mouth daily.    . fluticasone (FLONASE) 50 MCG/ACT nasal spray Place 2 sprays into both nostrils at bedtime. 16 g 2  . glucosamine-chondroitin 500-400 MG tablet Take 1 tablet by mouth 3 (three) times daily.    . hydrochlorothiazide (HYDRODIURIL)  25 MG tablet Take 25 mg by mouth daily.    . metFORMIN (GLUCOPHAGE) 500 MG tablet Take by mouth 2 (two) times daily with a meal.    . Multiple Vitamins-Minerals (MULTIVITAMIN WITH IRON-MINERALS) liquid Take by mouth daily.    . sacubitril-valsartan (ENTRESTO) 49-51 MG Take 1 tablet by mouth 2 (two) times daily. 60 tablet 0  . simvastatin (ZOCOR) 20 MG tablet Take 20 mg by mouth daily.    Marland Kitchen spironolactone (ALDACTONE) 25 MG tablet Take 0.5 tablets (12.5 mg total) by mouth daily. 30 tablet 0  . vitamin A 7500 UNIT capsule Take 1,200 Units by mouth daily.     No current facility-administered medications for this visit.      Allergies (verified) Pollen extract   PAST HISTORY  Family History Family History  Problem Relation Age of Onset  . Diabetes Mother   . Heart attack Sister        Age 28  . Cancer Sister     Social History Social History  Substance Use Topics  . Smoking status: Never Smoker  . Smokeless tobacco: Never Used  . Alcohol use 0.0 oz/week     Comment: "drinks heavy" per wife   Depression screen [facility] 2/9 10/30/2016 10/02/2016 04/14/2016 07/03/2015 01/17/2015  Decreased Interest 0 0 0 2 0  Down, Depressed, Hopeless 0 0 0 2 0  PHQ - 2 Score 0 0 0 4 0  Altered sleeping - - - 1 -  Tired, decreased energy - -  - 1 -  Change in appetite - - - 0 -  Feeling bad or failure about yourself  - - - 0 -  Trouble concentrating - - - 0 -  Moving slowly or fidgety/restless - - - 0 -  Suicidal thoughts - - - 0 -  PHQ-9 Score - - - 6 -  Difficult doing work/chores - - - Somewhat difficult -     List the Names of Other Physician/Practitioners you currently use: 1. [facility] Dr. Dell Ponto - prior CPE and chronic meds. [facility] Dr. Alto Denver 3.  Dr. Doran Durand, [facility] 4.  Ophtho??   History reviewed: allergies, current medications, past family history, past medical history, past social history, past surgical history and problem list  Review of Systems Pertinent items are noted in HPI.    Objective:   Blood pressure 135/82, pulse (!) 49, temperature 98.2 F (36.8 C), temperature source Oral, resp. rate 18, height 6\' 1"  (1.854 m), weight 241 lb (109.3 kg), SpO2 97 %. Body mass index is 31.8 kg/m.  General appearance: alert, cooperative and appears stated age Head: Normocephalic, without obvious abnormality, atraumatic Neck: no adenopathy, supple, symmetrical, trachea midline and thyroid not enlarged, symmetric, no tenderness/mass/nodules; no spinous process or prarspinal ttp Back: symmetric, no curvature. ROM normal. No CVA tenderness. Lungs: clear to auscultation bilaterally  Cardiac: RRR, no m/r/g Neuro: UExt 5/5 B. DTRs unable to elicit in B Upper ext.  Neg Tinel's/Phalen's.     Results for orders placed or performed in visit on 10/30/16  POCT urinalysis dipstick  Result Value Ref Range   Color, UA yellow yellow   Clarity, UA clear clear   Glucose, UA negative negative mg/dL   Bilirubin, UA negative negative   Ketones, POC UA negative negative mg/dL   Spec Grav, UA 1.020 1.010 - 1.025   Blood, UA negative negative   pH, UA 5.5 5.0 - 8.0   Protein Ur, POC trace (A) negative mg/dL  Urobilinogen, UA 0.2 0.2 or 1.0 E.U./dL   Nitrite, UA Negative Negative   Leukocytes, UA  Negative Negative   Dg Cervical Spine 2 Or 3 Views  Result Date: 10/30/2016 CLINICAL DATA:  Neck pain and right-sided cervical radiculopathy. EXAM: CERVICAL SPINE - 2-3 VIEW COMPARISON:  None. FINDINGS: Normal alignment of the cervical vertebral bodies. Mild to moderate disc disease in the mid and lower cervical spine. No acute bony findings. The facets are normally aligned. No abnormal prevertebral soft tissue swelling. The C1-2 articulations are maintained. The lung apices are clear. IMPRESSION: Moderate degenerative cervical spondylosis with disc disease and facet disease but no acute bony findings. Electronically Signed   By: Marijo Sanes M.D.   On: 10/30/2016 10:29    Assessment:     1. DIABETES MELLITUS, BORDERLINE   2. Hyperlipidemia LDL goal <70   3. Neuropathy, arm, right   4. Seasonal allergic rhinitis, unspecified trigger   5. Coronary artery disease involving native coronary artery of native heart without angina pectoris   6. Essential hypertension   7. Atrial fibrillation with RVR ([place])   8. Non-ischemic cardiomyopathy ([place])   9. Chronic systolic heart failure ([place])   10. Medication monitoring encounter   11. Paresthesia and pain of right extremity         Plan:  Lipids, cmp, a1c, urine microalb, cbc, tsh, b12, rpr, ua  ORDER MRI  PSA?? Hep C? Document optho exam or refer if > 1 yr Document tdap and likely did get both pneumococcal vaccines as has been having annual CPE at [place], records have been requested. . . UA with any blood? Make sure to send micro if present.  Consider repeat EKG. Possibly next visit.      EKG done 05/2015 abnml with Possible Left atrial enlargement, Left axis deviation, Non-specific intra-ventricular conduction block  Colonoscopy 2004 at [facility] with Dr. Fuller Plan with 4 polyps removed up to 4mm. Repeat in 10 yrs?? (4 yrs prior).  Shingles vaccine????   Orders Placed This Encounter  Procedures  . DG Cervical Spine 2 or 3 views     Standing Status:   Future    Number of Occurrences:   1    Standing Expiration Date:   10/30/2017    Order Specific Question:   Reason for Exam (SYMPTOM  OR DIAGNOSIS REQUIRED)    Answer:   neck pain with right cervical radiculopathy to the 2nd/3rd fingers    Order Specific Question:   Preferred imaging location?    Answer:   External  . Lipid panel    Order Specific Question:   Has the patient fasted?    Answer:   Yes  . Comprehensive metabolic panel    Order Specific Question:   Has the patient fasted?    Answer:   Yes  . Hemoglobin A1c  . Microalbumin/Creatinine Ratio, Urine  . CBC  . TSH  . Vitamin B12  . RPR  . POCT urinalysis dipstick     Delman Cheadle, M.D.  Primary Care at [facility] [address] [place], [place] [address] [phone] phone [phone] fax  11/01/16 3:40 PM

## 2016-10-30 ENCOUNTER — Ambulatory Visit (INDEPENDENT_AMBULATORY_CARE_PROVIDER_SITE_OTHER): Payer: Medicare Other

## 2016-10-30 ENCOUNTER — Encounter: Payer: Self-pay | Admitting: Family Medicine

## 2016-10-30 ENCOUNTER — Ambulatory Visit (INDEPENDENT_AMBULATORY_CARE_PROVIDER_SITE_OTHER): Payer: Medicare Other | Admitting: Family Medicine

## 2016-10-30 VITALS — BP 135/82 | HR 49 | Temp 98.2°F | Resp 18 | Ht 73.0 in | Wt 241.0 lb

## 2016-10-30 DIAGNOSIS — R7309 Other abnormal glucose: Secondary | ICD-10-CM

## 2016-10-30 DIAGNOSIS — I10 Essential (primary) hypertension: Secondary | ICD-10-CM | POA: Diagnosis not present

## 2016-10-30 DIAGNOSIS — J302 Other seasonal allergic rhinitis: Secondary | ICD-10-CM

## 2016-10-30 DIAGNOSIS — R202 Paresthesia of skin: Secondary | ICD-10-CM

## 2016-10-30 DIAGNOSIS — I2510 Atherosclerotic heart disease of native coronary artery without angina pectoris: Secondary | ICD-10-CM

## 2016-10-30 DIAGNOSIS — M79609 Pain in unspecified limb: Secondary | ICD-10-CM

## 2016-10-30 DIAGNOSIS — M47812 Spondylosis without myelopathy or radiculopathy, cervical region: Secondary | ICD-10-CM | POA: Diagnosis not present

## 2016-10-30 DIAGNOSIS — Z5181 Encounter for therapeutic drug level monitoring: Secondary | ICD-10-CM

## 2016-10-30 DIAGNOSIS — I428 Other cardiomyopathies: Secondary | ICD-10-CM | POA: Diagnosis not present

## 2016-10-30 DIAGNOSIS — I4891 Unspecified atrial fibrillation: Secondary | ICD-10-CM | POA: Diagnosis not present

## 2016-10-30 DIAGNOSIS — G5691 Unspecified mononeuropathy of right upper limb: Secondary | ICD-10-CM

## 2016-10-30 DIAGNOSIS — M5030 Other cervical disc degeneration, unspecified cervical region: Secondary | ICD-10-CM

## 2016-10-30 DIAGNOSIS — I5022 Chronic systolic (congestive) heart failure: Secondary | ICD-10-CM

## 2016-10-30 DIAGNOSIS — E785 Hyperlipidemia, unspecified: Secondary | ICD-10-CM | POA: Diagnosis not present

## 2016-10-30 DIAGNOSIS — M542 Cervicalgia: Secondary | ICD-10-CM

## 2016-10-30 DIAGNOSIS — Z0000 Encounter for general adult medical examination without abnormal findings: Secondary | ICD-10-CM | POA: Diagnosis not present

## 2016-10-30 LAB — POCT URINALYSIS DIP (MANUAL ENTRY)
BILIRUBIN UA: NEGATIVE mg/dL
Bilirubin, UA: NEGATIVE
Blood, UA: NEGATIVE
GLUCOSE UA: NEGATIVE mg/dL
LEUKOCYTES UA: NEGATIVE
Nitrite, UA: NEGATIVE
Spec Grav, UA: 1.02 (ref 1.010–1.025)
UROBILINOGEN UA: 0.2 U/dL
pH, UA: 5.5 (ref 5.0–8.0)

## 2016-10-30 NOTE — Patient Instructions (Addendum)
 IF you received an x-ray today, you will receive an invoice from [facility]. Please contact [facility] at [phone] with questions or concerns regarding your invoice.   IF you received labwork today, you will receive an invoice from [facility]. Please contact [facility] at [phone] with questions or concerns regarding your invoice.   Our billing staff will not be able to assist you with questions regarding bills from these companies.  You will be contacted with the lab results as soon as they are available. The fastest way to get your results is to activate your My Chart account. Instructions are located on the last page of this paperwork. If you have not heard from us regarding the results in 2 weeks, please contact this office.      Cervical Radiculopathy Cervical radiculopathy happens when a nerve in the neck (cervical nerve) is pinched or bruised. This condition can develop because of an injury or as part of the normal aging process. Pressure on the cervical nerves can cause pain or numbness that runs from the neck all the way down into the arm and fingers. Usually, this condition gets better with rest. Treatment may be needed if the condition does not improve. What are the causes? This condition may be caused by:  Injury.  Slipped (herniated) disk.  Muscle tightness in the neck because of overuse.  Arthritis.  Breakdown or degeneration in the bones and joints of the spine (spondylosis) due to aging.  Bone spurs that may develop near the cervical nerves.  What are the signs or symptoms? Symptoms of this condition include:  Pain that runs from the neck to the arm and hand. The pain can be severe or irritating. It may be worse when the neck is moved.  Numbness or weakness in the affected arm and hand.  How is this diagnosed? This condition may be diagnosed based on symptoms, medical history, and a physical exam. You may also have tests,  including:  X-rays.  CT scan.  MRI.  Electromyogram (EMG).  Nerve conduction tests.  How is this treated? In many cases, treatment is not needed for this condition. With rest, the condition usually gets better over time. If treatment is needed, options may include:  Wearing a soft neck collar for short periods of time.  Physical therapy to strengthen your neck muscles.  Medicines, such as NSAIDs, oral corticosteroids, or spinal injections.  Surgery. This may be needed if other treatments do not help. Various types of surgery may be done depending on the cause of your problems.  Follow these instructions at home: Managing pain  Take over-the-counter and prescription medicines only as told by your health care provider.  If directed, apply ice to the affected area. ? Put ice in a plastic bag. ? Place a towel between your skin and the bag. ? Leave the ice on for 20 minutes, 2-3 times per day.  If ice does not help, you can try using heat. Take a warm shower or warm bath, or use a heat pack as told by your health care provider.  Try a gentle neck and shoulder massage to help relieve symptoms. Activity  Rest as needed. Follow instructions from your health care provider about any restrictions on activities.  Do stretching and strengthening exercises as told by your health care provider or physical therapist. General instructions  If you were given a soft collar, wear it as told by your health care provider.  Use a flat pillow when you sleep.    Keep all follow-up visits as told by your health care provider. This is important. Contact a health care provider if:  Your condition does not improve with treatment. Get help right away if:  Your pain gets much worse and cannot be controlled with medicines.  You have weakness or numbness in your hand, arm, face, or leg.  You have a high fever.  You have a stiff, rigid neck.  You lose control of your bowels or your bladder  (have incontinence).  You have trouble with walking, balance, or speaking. This information is not intended to replace advice given to you by your health care provider. Make sure you discuss any questions you have with your health care provider. Document Released: 12/03/2000 Document Revised: 08/16/2015 Document Reviewed: 05/04/2014 Elsevier Interactive Patient Education  2018 [employer] Inc[employer]  

## 2016-10-31 LAB — COMPREHENSIVE METABOLIC PANEL
ALK PHOS: 78 IU/L (ref 39–117)
ALT: 16 IU/L (ref 0–44)
AST: 20 IU/L (ref 0–40)
Albumin/Globulin Ratio: 1.6 (ref 1.2–2.2)
Albumin: 4.5 g/dL (ref 3.5–4.8)
BILIRUBIN TOTAL: 0.4 mg/dL (ref 0.0–1.2)
BUN/Creatinine Ratio: 17 (ref 10–24)
BUN: 21 mg/dL (ref 8–27)
CALCIUM: 10 mg/dL (ref 8.6–10.2)
CHLORIDE: 100 mmol/L (ref 96–106)
CO2: 25 mmol/L (ref 20–29)
Creatinine, Ser: 1.21 mg/dL (ref 0.76–1.27)
GFR calc Af Amer: 70 mL/min/{1.73_m2} (ref >59)
GFR, EST NON AFRICAN AMERICAN: 60 mL/min/{1.73_m2} (ref >59)
GLOBULIN, TOTAL: 2.9 g/dL (ref 1.5–4.5)
Glucose: 181 mg/dL — ABNORMAL HIGH (ref 65–99)
POTASSIUM: 4.9 mmol/L (ref 3.5–5.2)
SODIUM: 140 mmol/L (ref 134–144)
Total Protein: 7.4 g/dL (ref 6.0–8.5)

## 2016-10-31 LAB — MICROALBUMIN / CREATININE URINE RATIO
CREATININE, UR: 193.1 mg/dL
MICROALBUM., U, RANDOM: 32.3 ug/mL
Microalb/Creat Ratio: 16.7 mg/g creat (ref 0.0–30.0)

## 2016-10-31 LAB — TSH: TSH: 2.32 u[IU]/mL (ref 0.450–4.500)

## 2016-10-31 LAB — CBC
HEMATOCRIT: 44.7 % (ref 37.5–51.0)
Hemoglobin: 15.3 g/dL (ref 13.0–17.7)
MCH: 30.7 pg (ref 26.6–33.0)
MCHC: 34.2 g/dL (ref 31.5–35.7)
MCV: 90 fL (ref 79–97)
PLATELETS: 219 10*3/uL (ref 150–379)
RBC: 4.98 x10E6/uL (ref 4.14–5.80)
RDW: 13.6 % (ref 12.3–15.4)
WBC: 4.4 10*3/uL (ref 3.4–10.8)

## 2016-10-31 LAB — LIPID PANEL
CHOL/HDL RATIO: 3.6 ratio (ref 0.0–5.0)
CHOLESTEROL TOTAL: 139 mg/dL (ref 100–199)
HDL: 39 mg/dL — ABNORMAL LOW (ref >39)
LDL CALC: 72 mg/dL (ref 0–99)
TRIGLYCERIDES: 141 mg/dL (ref 0–149)
VLDL CHOLESTEROL CAL: 28 mg/dL (ref 5–40)

## 2016-10-31 LAB — HEMOGLOBIN A1C
ESTIMATED AVERAGE GLUCOSE: 183 mg/dL
HEMOGLOBIN A1C: 8 % — AB (ref 4.8–5.6)

## 2016-10-31 LAB — RPR: RPR: NONREACTIVE

## 2016-10-31 LAB — VITAMIN B12: VITAMIN B 12: 282 pg/mL (ref 232–1245)

## 2016-11-01 NOTE — Addendum Note (Signed)
Addended by: Shawnee Knapp on: 11/01/2016 03:53 PM   Modules accepted: Orders

## 2016-11-19 ENCOUNTER — Ambulatory Visit
Admission: RE | Admit: 2016-11-19 | Discharge: 2016-11-19 | Disposition: A | Discharge status: Home / Self Care | Payer: Medicare Other | Source: Ambulatory Visit | Attending: Family Medicine | Admitting: Family Medicine

## 2016-11-19 DIAGNOSIS — M50123 Cervical disc disorder at C6-C7 level with radiculopathy: Secondary | ICD-10-CM | POA: Diagnosis not present

## 2016-11-19 DIAGNOSIS — M5030 Other cervical disc degeneration, unspecified cervical region: Secondary | ICD-10-CM

## 2016-11-19 DIAGNOSIS — M542 Cervicalgia: Secondary | ICD-10-CM

## 2016-11-19 DIAGNOSIS — M50122 Cervical disc disorder at C5-C6 level with radiculopathy: Secondary | ICD-10-CM | POA: Diagnosis not present

## 2016-11-27 ENCOUNTER — Ambulatory Visit: Payer: Medicare Other | Admitting: Family Medicine

## 2016-12-03 ENCOUNTER — Ambulatory Visit: Payer: Medicare Other | Admitting: Family Medicine

## 2017-01-07 ENCOUNTER — Ambulatory Visit (INDEPENDENT_AMBULATORY_CARE_PROVIDER_SITE_OTHER): Payer: Medicare Other | Admitting: Emergency Medicine

## 2017-01-07 DIAGNOSIS — Z23 Encounter for immunization: Secondary | ICD-10-CM

## 2017-01-07 NOTE — Progress Notes (Signed)
Flu shot only

## 2017-01-28 IMAGING — CR DG CHEST 1V PORT
2 series · 2 of 2 positions shown · non-contrast
Comparison: Chest radiograph performed 06/20/2015

CLINICAL DATA: Acute onset of respiratory distress. Initial
encounter.

EXAM:
PORTABLE CHEST 1 VIEW

[AP (1 of 2)]
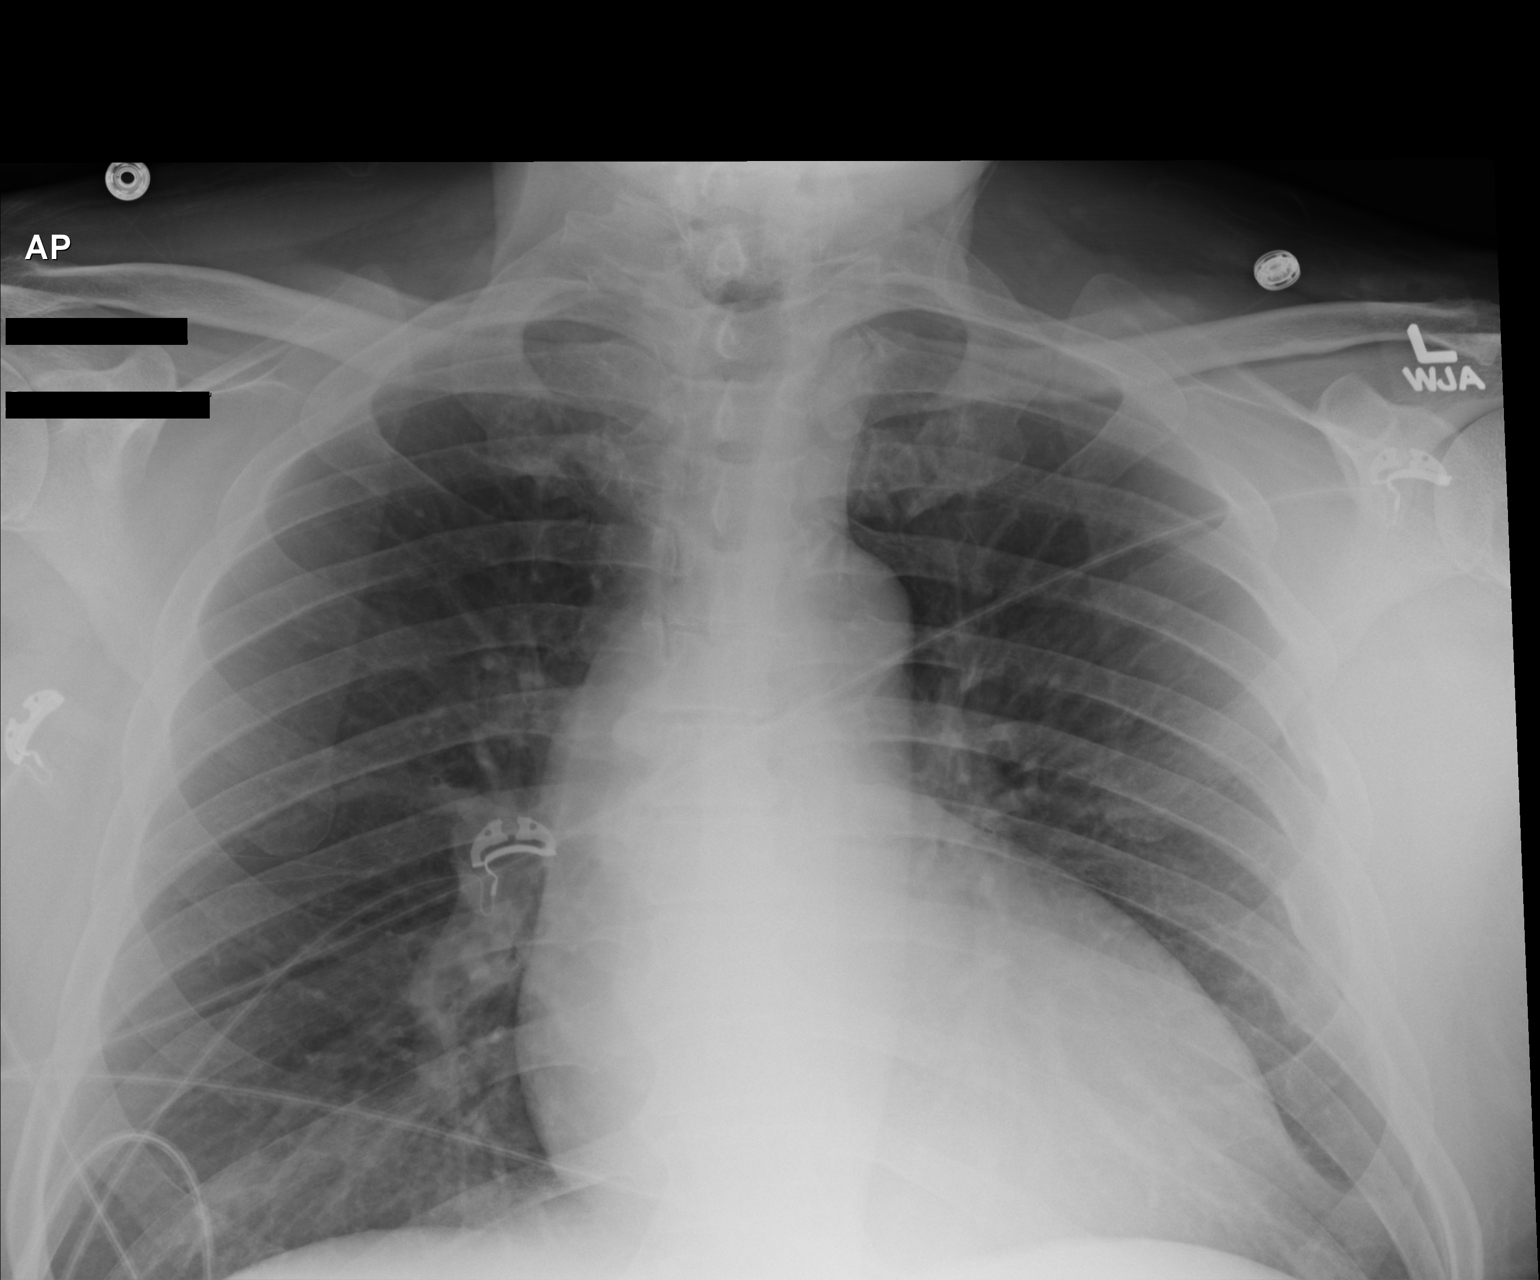

[AP (2 of 2)]
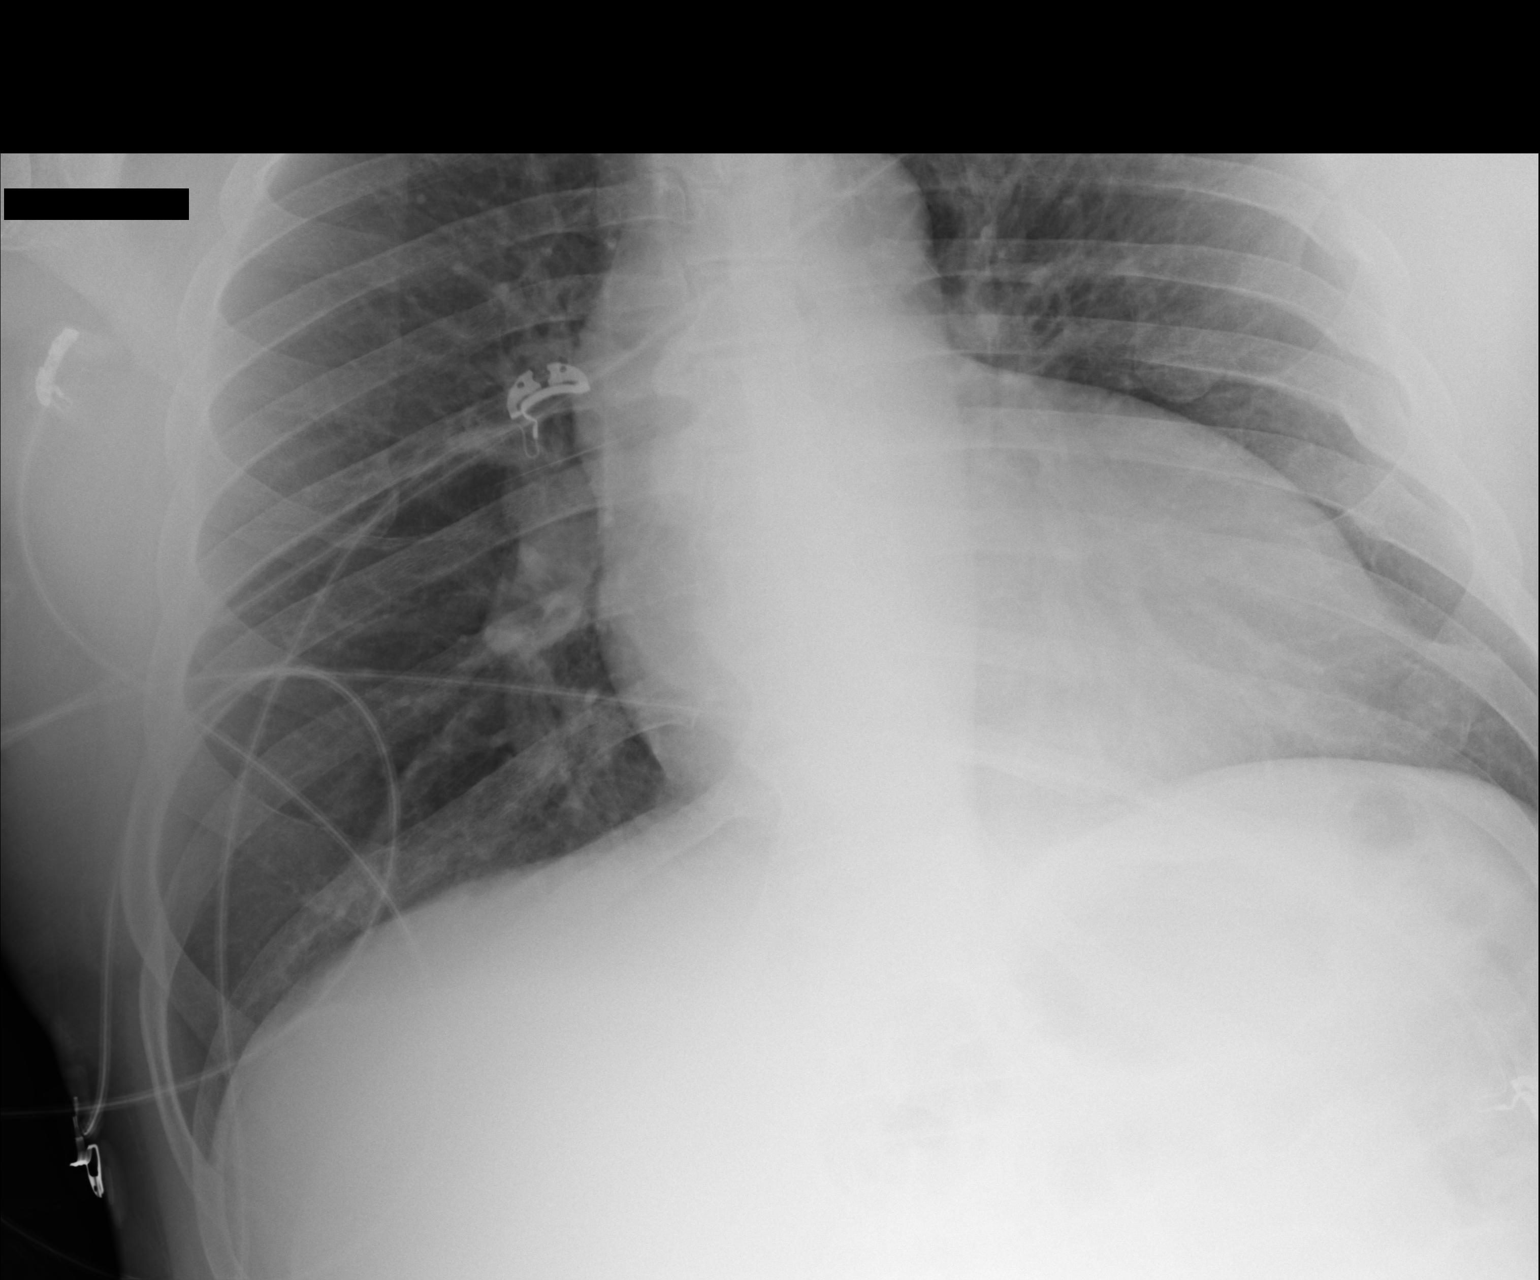

[2 of 2 positions shown; findings below may reference images not displayed]

FINDINGS: The lungs are well-aerated and clear. There is no evidence of focal
opacification, pleural effusion or pneumothorax.

The cardiomediastinal silhouette is borderline normal in size. No
acute osseous abnormalities are seen. Mild chronic left-sided rib
deformities are noted.
IMPRESSION: No acute cardiopulmonary process seen.

## 2017-02-09 ENCOUNTER — Ambulatory Visit (INDEPENDENT_AMBULATORY_CARE_PROVIDER_SITE_OTHER): Payer: Medicare Other | Admitting: Family Medicine

## 2017-02-09 ENCOUNTER — Encounter: Payer: Self-pay | Admitting: Family Medicine

## 2017-02-09 ENCOUNTER — Other Ambulatory Visit: Payer: Self-pay

## 2017-02-09 VITALS — BP 118/74 | HR 62 | Temp 97.7°F | Resp 16 | Ht 73.0 in | Wt 247.0 lb

## 2017-02-09 DIAGNOSIS — E1142 Type 2 diabetes mellitus with diabetic polyneuropathy: Secondary | ICD-10-CM

## 2017-02-09 DIAGNOSIS — Z113 Encounter for screening for infections with a predominantly sexual mode of transmission: Secondary | ICD-10-CM | POA: Diagnosis not present

## 2017-02-09 DIAGNOSIS — E538 Deficiency of other specified B group vitamins: Secondary | ICD-10-CM | POA: Diagnosis not present

## 2017-02-09 DIAGNOSIS — M5412 Radiculopathy, cervical region: Secondary | ICD-10-CM

## 2017-02-09 LAB — POCT GLYCOSYLATED HEMOGLOBIN (HGB A1C): Hemoglobin A1C: 7.1

## 2017-02-09 MED ORDER — CYANOCOBALAMIN 1000 MCG/ML IJ SOLN
1000.0000 ug | INTRAMUSCULAR | Status: DC
  Administered 2017-02-09: 1000 ug via INTRAMUSCULAR

## 2017-02-09 NOTE — Progress Notes (Signed)
Subjective:    Patient ID: Michael Roberts, male    DOB: 1945/07/13, 71 y.o.   MRN: [id number] Chief Complaint  Patient presents with  . Follow-up    MRI     HPI 1.  DMII:  On metformin 500 bid. Not on eliquis 5 bid. Foot exam done last mo.  Ophtho was about a year ago and has another appt scheduled.  Microalb not done yet - will collect today. Does not check blood sugars.        Lab Results  Component Value Date   HGBA1C 7.0 04/14/2016   HGBA1C (H) 7.0 10/02/2008       DMII: Diagnosed 2012.   Lab Results  Component Value Date   HGBA1C 8.0 (H) 10/30/2016   HGBA1C 7.0 04/14/2016   HGBA1C (H) 10/02/2008    7.0 (NOTE) The ADA recommends the following therapeutic goal for glycemic control related to Hgb A1c measurement: Goal of therapy: <6.5 Hgb A1c  Reference: American Diabetes Association: Clinical Practice Recommendations 2010, Diabetes Care, 2010, 33: (Suppl  1).   CBGs: Not checking at home lately fasting a.m. ; after meal  ; No hypoglycemic episodes.  Meter type: One touch. Diet:  Exercising:   DM Med Regimen: metformin 500 bid Prior changes:   eGFR:70 Baseline Cr:1.21  Last checked 10/30/2016. Microalb:  Normal 10/30/2016. On arb valsartan (in Entresto) Lipids:  LDL 72,  non-HDL 100.  Last levels done 3 mos prior 10/30/16 - were steady prior. On simvastatin 20. Taking asa 81 qd.  Optho: Seen annually by - last exam 11/2015 Feet: Monofilament exam done 10/02/16.   Immunizations:  Influenza: annually - done this yr last mo  Pneumovax-23: assumed done at [place]  2.  Severe chronic systolic CHF: diagnosed on TTE 2010. Last exacerbation resulted in a 1 wk hospitalization in March 2017 when he developed flash pulmonary edema resulting in respiratory failure after new onset A. Fib.  At that time, EF 25-30% w/ diffuse hypokinesis on left heart cath 06/22/15 by Dr. Angelena Form which showed severe LV systolic dysfunction but no sig CAD. EKG also last done in Epic 05/2015 abnml with  Possible Left atrial enlargement, Left axis deviation, Non-specific intra-ventricular conduction block.  On coreg 6.25 bid, entresto 49-51 bid, simvastatin 20 qd, hctz 25, and spironolactone 12.5 qd. Now follows with [facility] Dr. Alto Denver. Patient reports he was told based upon his follow-up echo done last year that he has made amazing recovery - a "poster child" for recovery. Patient has been putting a lot of effort into lifestyle changes with daily exercise.  Did have more more congestion over the past few days with cough after he was eating more salt on vacation. No Orthopnea, no DOE, resolving now that he is back on his regular diet with low salt.   3.  A. Fib: Dx'ed 05/2015 when presented with respiratory failure 2/2 flash pulm edema 2/2 exacerbation of systolic CHF due to new onset a. Fib.  CHA2DS2-VASc Score was 4 so started on NOAC (Eliquis 5 bid) 06/2015 by which time pt had converted back to NSR.  On coreg with rates in 50-70s.   4.  H/o MI and CVA - no angiographic evidence of CAD seen on last cath 05/2015.  5.  HTN: coreg 6.25 bid, entresto 49-51 bid, hctz 25, and spironolactone 12.5 qd. 6.  HLD goal LDL <70: simvastatin 20.  Stopped fish oil last month when he starting eating more fish.  Lab Results  Component Value Date  LDLCALC 72 10/30/2016   LDLCALC 75 06/21/2015    7.  H/o hematuria when started on anticoagulants - 05/2015 - poss just myoglobin on dipstick. If true hematuria, needs full urologic eval.  8.  H/o severe intermittent seasonal allergies - on flonase.  9.  Taking vit D 2000u/d - was started 1200 by Dr. Henrene Pastor and then increased to 2000u/d. Also on mvi which has about 1000 vit D.  Vitamin B12 deficiency -   10.  Arthralgias:  treated with cortisone injections and Glucosamine-chondroiton supp tid.  Trying to avoid systemic NSAIDS due to CHF and has failed pred prior.  ? H/o gout prior (usu Lt knee) so on colchicine from Dr. Doran Durand at [facility].  Uric acid 6  mos prev slightly above goal (though not abnml) at 8.  Pt really not sure that flair was gout and he did not take the medications as before had similar pain in left ankle which was actually tendonitis.  Severe 5th finger arthritis - significant contractures at IP joints but worried if he has them straightened he will not have enough grip strength to play the guitar - can still play now despite OA.  12.  Rt cervical radiculopathy with weakness and paresthesias all the way to 2nd/3rd fingers. Pt really is wanting to pursue the MRI and further eval/trx (poss NCV/EMG or PT/OT) through [place] due to cost but having sig difficulty accessing care.  No improvement in symptoms after prednisone bursts. MRI: 2. At C5-6 there is a broad-based disc bulge with a left paracentral disc protrusion contacting the ventral left paracentral cervical spinal cord. Bilateral uncovertebral degenerative changes. Severe right foraminal stenosis. Moderate left foraminal stenosis. 3. At C6-7 there is a broad-based disc bulge. Bilateral uncovertebral degenerative changes. Moderate-severe bilateral foraminal stenosis.  Past Medical History:  Diagnosis Date  . Cataract 2018   developing in right  . Chronic systolic CHF (congestive heart failure) (HCC)    a. EF 30% by echo in 2010  . Diabetes mellitus without complication ([place])   . HLD (hyperlipidemia)   . HTN (hypertension)   . Myocardial infarction ([place])   . Stroke [facility])    Past Surgical History:  Procedure Laterality Date  . CARDIAC CATHETERIZATION N/A 06/22/2015   Procedure: Left Heart Cath and Coronary Angiography;  Surgeon: Burnell Blanks, MD;  Location: [place] [facility];  Service: Cardiovascular;  Laterality: N/A;   Current Outpatient Medications on File Prior to Visit  Medication Sig Dispense Refill  . apixaban (ELIQUIS) 5 MG TABS tablet Take 1 tablet (5 mg total) by mouth 2 (two) times daily. 60 tablet 0  . carvedilol (COREG) 6.25 MG tablet Take 1  tablet (6.25 mg total) by mouth 2 (two) times daily with a meal. 60 tablet 0  . cholecalciferol (VITAMIN D) 1000 units tablet Take 2,000 Units by mouth daily.    . fluticasone (FLONASE) 50 MCG/ACT nasal spray Place 2 sprays into both nostrils at bedtime. 16 g 2  . glucosamine-chondroitin 500-400 MG tablet Take 1 tablet by mouth 3 (three) times daily.    . hydrochlorothiazide (HYDRODIURIL) 25 MG tablet Take 25 mg by mouth daily.    . metFORMIN (GLUCOPHAGE) 500 MG tablet Take by mouth 2 (two) times daily with a meal.    . Multiple Vitamins-Minerals (MULTIVITAMIN WITH IRON-MINERALS) liquid Take by mouth daily.    . sacubitril-valsartan (ENTRESTO) 49-51 MG Take 1 tablet by mouth 2 (two) times daily. 60 tablet 0  . simvastatin (ZOCOR) 20 MG tablet  Take 20 mg by mouth daily.    Marland Kitchen spironolactone (ALDACTONE) 25 MG tablet Take 0.5 tablets (12.5 mg total) by mouth daily. 30 tablet 0  . vitamin A 7500 UNIT capsule Take 1,200 Units by mouth daily.     No current facility-administered medications on file prior to visit.    Allergies  Allergen Reactions  . Pollen Extract Other (See Comments)    sneezing   Family History  Problem Relation Age of Onset  . Diabetes Mother   . Heart attack Sister        Age 72  . Cancer Sister    Social History   Socioeconomic History  . Marital status: Married    Spouse name: Bennet Kujawa  . Number of children: None  . Years of education: None  . Highest education level: None  Social Needs  . Financial resource strain: None  . Food insecurity - worry: None  . Food insecurity - inability: None  . Transportation needs - medical: None  . Transportation needs - non-medical: None  Occupational History  . None  Tobacco Use  . Smoking status: Never Smoker  . Smokeless tobacco: Never Used  Substance and Sexual Activity  . Alcohol use: Yes    Alcohol/week: 0.0 oz    Comment: "drinks heavy" per wife  . Drug use: No  . Sexual activity: None  Other Topics  Concern  . None  Social History Narrative   Wife is Miroslav Gin   Depression screen [facility] 2/9 02/09/2017 10/30/2016 10/02/2016 04/14/2016 07/03/2015  Decreased Interest 0 0 0 0 2  Down, Depressed, Hopeless 0 0 0 0 2  PHQ - 2 Score 0 0 0 0 4  Altered sleeping - - - - 1  Tired, decreased energy - - - - 1  Change in appetite - - - - 0  Feeling bad or failure about yourself  - - - - 0  Trouble concentrating - - - - 0  Moving slowly or fidgety/restless - - - - 0  Suicidal thoughts - - - - 0  PHQ-9 Score - - - - 6  Difficult doing work/chores - - - - Somewhat difficult     Review of Systems  Constitutional: Negative for activity change, appetite change, chills, fever and unexpected weight change.  Eyes: Negative for visual disturbance.  Gastrointestinal: Negative for abdominal distention, abdominal pain, constipation, diarrhea, nausea and vomiting.  Musculoskeletal: Positive for arthralgias, back pain, joint swelling and neck stiffness. Negative for gait problem and myalgias.  Neurological: Positive for numbness. Negative for weakness.  see hpi     Objective:   Physical Exam  Constitutional: He is oriented to person, place, and time. He appears well-developed and well-nourished. No distress.  HENT:  Head: Normocephalic and atraumatic.  Eyes: Conjunctivae are normal. Pupils are equal, round, and reactive to light. No scleral icterus.  Neck: Normal range of motion. Neck supple. No thyromegaly present.  Cardiovascular: Normal rate, regular rhythm, normal heart sounds and intact distal pulses.  Pulmonary/Chest: Effort normal and breath sounds normal. No respiratory distress.  Musculoskeletal: He exhibits no edema.  Lymphadenopathy:    He has no cervical adenopathy.  Neurological: He is alert and oriented to person, place, and time.  Skin: Skin is warm and dry. He is not diaphoretic.  Psychiatric: He has a normal mood and affect. His behavior is normal.      BP 118/74   Pulse 62    Temp 97.7 F (36.5 C)  Resp 16   Ht '6\' 1"'$  (1.854 m)   Wt 247 lb (112 kg)   SpO2 98%   BMI 32.59 kg/m   Results for orders placed or performed in visit on 02/09/17  HCV Ab w/Rflx to Verification  Result Value Ref Range   HCV Ab <0.1 0.0 - 0.9 s/co ratio  Interpretation:  Result Value Ref Range   HCV interp 1: Comment   POCT glycosylated hemoglobin (Hb A1C)  Result Value Ref Range   Hemoglobin A1C 7.1    11/19/2016 MRI C-spine w/o Contrast IMPRESSION: 1. At C4-5 there is a mild broad based disc bulge. Bilateral uncovertebral degenerative changes. Mild bilateral foraminal stenosis. 2. At C5-6 there is a broad-based disc bulge with a left paracentral disc protrusion contacting the ventral left paracentral cervical spinal cord. Bilateral uncovertebral degenerative changes. Severe right foraminal stenosis. Moderate left foraminal stenosis. 3. At C6-7 there is a broad-based disc bulge. Bilateral uncovertebral degenerative changes. Moderate-severe bilateral foraminal stenosis. 4. Lipoma in the subcutaneous fat of the posterior cervical soft tissues.  Assessment & Plan:  Hep C, a1c,  ekg - repeeat at next visit. Colonoscopy optho exam (last 11/23/15)  1. Type 2 diabetes mellitus with diabetic polyneuropathy, without long-term current use of insulin (HCC) -  Much improved w/ diet changes but would still be good to aim for high 6s on a1c. However, wants to cont on current regimen of metformin 500 bid until he follows up with the [place] in the next mo or two. Advised that he can take an extra metformin 500 if he is eating a meal that is high carb or going to have dessert.  2. Routine screening for STI (sexually transmitted infection)   3. Vitamin B12 deficiency - new diagnosis - will start otc supp but will try injection today in order to attempt to better tell if the replacement will have any beneficial effect on his paresthesias. RTC in sev mos after oral supp to recheck b12 level (or at  [place]).  4. RIGHT Cervical radiculopathy at C6 - reviewed MRI w/ pt - he does have sig degenerative pathology with area of left cord impigement and numerous stenotic areas w/ poss nerve impingment. Pt denies weakness or pain - just numbness which at this point is not progressing so pt would like to proceed with watchful waiting - lots of sig pathology - offered referral to ortho spine or neurosurg to discuss trial of PT vs injections or other - ? Surgery - but pt will let me know if it worsens - e.g. developing weakness and will refer then.    Orders Placed This Encounter  Procedures  . HCV Ab w/Rflx to Verification  . Interpretation:  . POCT glycosylated hemoglobin (Hb A1C)    Meds ordered this encounter  Medications  . cyanocobalamin ((VITAMIN B-12)) injection 1,000 mcg      Delman Cheadle, M.D.  Primary Care at [facility] [address] [place],  [address] [phone] phone [phone] fax  02/12/17 7:40 AM

## 2017-02-09 NOTE — Patient Instructions (Addendum)
Start a daily vitamin b complex supplement (most have 500-1067mcg of vitamin B12 in them.)  We will recheck your levels at your next office visit to ensure you are able to absorb them.  Increase your metformin to 2 tabs before you are going to have a lot of starchy carbs with a meal ("the white foods") or dessert.   IF you received an x-ray today, you will receive an invoice from [facility]. Please contact [facility] at [phone] with questions or concerns regarding your invoice.   IF you received labwork today, you will receive an invoice from [facility]. Please contact [facility] at [phone] with questions or concerns regarding your invoice.   Our billing staff will not be able to assist you with questions regarding bills from these companies.  You will be contacted with the lab results as soon as they are available. The fastest way to get your results is to activate your My Chart account. Instructions are located on the last page of this paperwork. If you have not heard from [place] regarding the results in 2 weeks, please contact this office.     Vitamin B12 Deficiency Vitamin B12 deficiency occurs when the body does not have enough vitamin B12. Vitamin B12 is an important vitamin. The body needs vitamin B12:  To make red blood cells.  To make DNA. This is the genetic material inside cells.  To help the nerves work properly so they can carry messages from the brain to the body.  Vitamin B12 deficiency can cause various health problems, such as a low red blood cell count (anemia) or nerve damage. What are the causes? This condition may be caused by:  Not eating enough foods that contain vitamin B12.  Not having enough stomach acid and digestive fluids to properly absorb vitamin B12 from the food that you eat.  Certain digestive system diseases that make it hard to absorb vitamin B12. These diseases include Crohn disease, chronic pancreatitis, and cystic  fibrosis.  Pernicious anemia. This is a condition in which the body does not make enough of a protein (intrinsic factor), resulting in too few red blood cells.  Having a surgery in which part of the stomach or small intestine is removed.  Taking certain medicines that make it hard for the body to absorb vitamin B12. These medicines include: ? Heartburn medicine (antacids and proton pump inhibitors). ? An antibiotic medicine called neomycin. ? Some medicines that are used to treat diabetes, tuberculosis, gout, or high cholesterol.  What increases the risk? The following factors may make you more likely to develop a B12 deficiency:  Being older than age 88.  Eating a vegetarian or vegan diet, especially while you are pregnant.  Eating a poor diet while you are pregnant.  Taking certain drugs.  Having alcoholism.  What are the signs or symptoms? In some cases, there are no symptoms of this condition. If the condition leads to anemia or nerve damage, various symptoms can occur, such as:  Weakness.  Fatigue.  Loss of appetite.  Weight loss.  Numbness or tingling in your hands and feet.  Redness and burning of the tongue.  Confusion or memory problems.  Depression.  Sensory problems, such as color blindness, ringing in the ears, or loss of taste.  Diarrhea or constipation.  Trouble walking.  If anemia is severe, symptoms can include:  Shortness of breath.  Dizziness.  Rapid heart rate (tachycardia).  How is this diagnosed? This condition may be diagnosed with a blood  test to measure the level of vitamin B12 in your blood. You may have other tests to help find the cause of your vitamin B12 deficiency. These tests may include:  A complete blood count (CBC). This is a group of tests that measure certain characteristics of blood cells.  A blood test to measure intrinsic factor.  An endoscopy. In this procedure, a thin tube with a camera on the end is used to look  into your stomach or intestines.  How is this treated? Treatment for this condition depends on the cause. Common treatment options include:  Changing your eating and drinking habits, such as: ? Eating more foods that contain vitamin B12. ? Drinking less alcohol or no alcohol.  Taking vitamin B12 supplements. Your health care provider will tell you which dosage is best for you.  Getting vitamin B12 injections.  Follow these instructions at home:  Take supplements only as told by your health care provider. Follow the directions carefully.  Get any injections that are prescribed by your health care provider.  Do not miss your appointments.  Eat lots of healthy foods that contain vitamin B12. Ask your health care provider if you should work with a dietitian. Foods that contain vitamin B12 include: ? Meat. ? Meat from birds (poultry). ? Fish. ? Eggs. ? Cereal and dairy products that are fortified. This means that vitamin B12 has been added to the food. Check the label on the package to see if the food is fortified.  Do not abuse alcohol.  Keep all follow-up visits as told by your health care provider. This is important. Contact a health care provider if:  Your symptoms come back. Get help right away if:  You develop shortness of breath.  You have chest pain.  You become dizzy or you lose consciousness. This information is not intended to replace advice given to you by your health care provider. Make sure you discuss any questions you have with your health care provider. Document Released: 06/02/2011 Document Revised: 08/22/2015 Document Reviewed: 07/26/2014 Elsevier Interactive Patient Education  2018 [employer].   Cervical Radiculopathy Cervical radiculopathy happens when a nerve in the neck (cervical nerve) is pinched or bruised. This condition can develop because of an injury or as part of the normal aging process. Pressure on the cervical nerves can cause pain or  numbness that runs from the neck all the way down into the arm and fingers. Usually, this condition gets better with rest. Treatment may be needed if the condition does not improve. What are the causes? This condition may be caused by:  Injury.  Slipped (herniated) disk.  Muscle tightness in the neck because of overuse.  Arthritis.  Breakdown or degeneration in the bones and joints of the spine (spondylosis) due to aging.  Bone spurs that may develop near the cervical nerves.  What are the signs or symptoms? Symptoms of this condition include:  Pain that runs from the neck to the arm and hand. The pain can be severe or irritating. It may be worse when the neck is moved.  Numbness or weakness in the affected arm and hand.  How is this diagnosed? This condition may be diagnosed based on symptoms, medical history, and a physical exam. You may also have tests, including:  X-rays.  CT scan.  MRI.  Electromyogram (EMG).  Nerve conduction tests.  How is this treated? In many cases, treatment is not needed for this condition. With rest, the condition usually gets better over time.  If treatment is needed, options may include:  Wearing a soft neck collar for short periods of time.  Physical therapy to strengthen your neck muscles.  Medicines, such as NSAIDs, oral corticosteroids, or spinal injections.  Surgery. This may be needed if other treatments do not help. Various types of surgery may be done depending on the cause of your problems.  Follow these instructions at home: Managing pain  Take over-the-counter and prescription medicines only as told by your health care provider.  If directed, apply ice to the affected area. ? Put ice in a plastic bag. ? Place a towel between your skin and the bag. ? Leave the ice on for 20 minutes, 2-3 times per day.  If ice does not help, you can try using heat. Take a warm shower or warm bath, or use a heat pack as told by your health  care provider.  Try a gentle neck and shoulder massage to help relieve symptoms. Activity  Rest as needed. Follow instructions from your health care provider about any restrictions on activities.  Do stretching and strengthening exercises as told by your health care provider or physical therapist. General instructions  If you were given a soft collar, wear it as told by your health care provider.  Use a flat pillow when you sleep.  Keep all follow-up visits as told by your health care provider. This is important. Contact a health care provider if:  Your condition does not improve with treatment. Get help right away if:  Your pain gets much worse and cannot be controlled with medicines.  You have weakness or numbness in your hand, arm, face, or leg.  You have a high fever.  You have a stiff, rigid neck.  You lose control of your bowels or your bladder (have incontinence).  You have trouble with walking, balance, or speaking. This information is not intended to replace advice given to you by your health care provider. Make sure you discuss any questions you have with your health care provider. Document Released: 12/03/2000 Document Revised: 08/16/2015 Document Reviewed: 05/04/2014 Elsevier Interactive Patient Education  [employer].

## 2017-02-10 LAB — HCV AB W/RFLX TO VERIFICATION: HCV Ab: <0.1 s/co ratio (ref 0.0–0.9)

## 2017-02-10 LAB — HCV INTERPRETATION

## 2017-03-24 DIAGNOSIS — C61 Malignant neoplasm of prostate: Secondary | ICD-10-CM

## 2017-03-24 HISTORY — DX: Malignant neoplasm of prostate: C61

## 2017-04-14 ENCOUNTER — Telehealth: Payer: Self-pay | Admitting: Family Medicine

## 2017-04-14 NOTE — Telephone Encounter (Signed)
Copied from [place]. Topic: Quick Communication - See Telephone Encounter >> Apr 14, 2017 12:10 PM Aurelio Brash B wrote: CRM for notification. See Telephone encounter for:  Pt is asking for his psa levels 04/14/17.

## 2017-04-17 NOTE — Telephone Encounter (Signed)
Advised pt that we have not drawn a PSA level on him.

## 2017-06-01 DIAGNOSIS — R972 Elevated prostate specific antigen [PSA]: Secondary | ICD-10-CM | POA: Diagnosis not present

## 2017-06-08 DIAGNOSIS — R972 Elevated prostate specific antigen [PSA]: Secondary | ICD-10-CM | POA: Diagnosis not present

## 2017-06-08 DIAGNOSIS — R3915 Urgency of urination: Secondary | ICD-10-CM | POA: Diagnosis not present

## 2017-07-07 DIAGNOSIS — R972 Elevated prostate specific antigen [PSA]: Secondary | ICD-10-CM | POA: Diagnosis not present

## 2017-07-20 DIAGNOSIS — C61 Malignant neoplasm of prostate: Secondary | ICD-10-CM | POA: Diagnosis not present

## 2017-07-20 DIAGNOSIS — R3915 Urgency of urination: Secondary | ICD-10-CM | POA: Diagnosis not present

## 2017-08-18 ENCOUNTER — Encounter: Payer: Self-pay | Admitting: Family Medicine

## 2018-01-18 DIAGNOSIS — R3915 Urgency of urination: Secondary | ICD-10-CM | POA: Diagnosis not present

## 2018-03-02 ENCOUNTER — Other Ambulatory Visit: Payer: Self-pay | Admitting: Urology

## 2018-03-02 DIAGNOSIS — C61 Malignant neoplasm of prostate: Secondary | ICD-10-CM

## 2018-03-07 ENCOUNTER — Telehealth: Payer: Self-pay | Admitting: Family Medicine

## 2018-03-07 NOTE — Telephone Encounter (Signed)
Received message from [facility] that patient was to be seen by MD immediately as he has lost 7.4 pounds in the past 4 days from 11 30-12 3.  His weight got down to 231 which was the lowest it had been in the past month.  Patient's weights are being monitored regularly at home as part of the [place] [facility] heart failure program.  ------------- Patient has not been seen in the office for over a year.  Please call him and have him establish an appointment for evaluation of his chronic medical conditions ASAP.  Please have him also go ahead and schedule a second appointment for his complete physical with me.

## 2018-03-10 ENCOUNTER — Telehealth: Payer: Self-pay | Admitting: Family Medicine

## 2018-03-10 NOTE — Telephone Encounter (Signed)
Called and spoke with pt per Dr. Brigitte Pulse and [facility] - pt needed appt regarding weight loss. Pt will be coming in 03/11/18 at 3:00. I advised of time, building and late policy. Pt acknowledged.   Also reminded pt to make appt for CPE when he checks out tomorrow.

## 2018-03-12 ENCOUNTER — Encounter: Payer: Self-pay | Admitting: Family Medicine

## 2018-03-12 ENCOUNTER — Ambulatory Visit (INDEPENDENT_AMBULATORY_CARE_PROVIDER_SITE_OTHER): Payer: Medicare Other | Admitting: Family Medicine

## 2018-03-12 ENCOUNTER — Other Ambulatory Visit: Payer: Self-pay

## 2018-03-12 VITALS — BP 148/82 | HR 71 | Temp 98.0°F | Resp 16 | Wt 239.0 lb

## 2018-03-12 DIAGNOSIS — R634 Abnormal weight loss: Secondary | ICD-10-CM | POA: Diagnosis not present

## 2018-03-12 DIAGNOSIS — R591 Generalized enlarged lymph nodes: Secondary | ICD-10-CM

## 2018-03-12 DIAGNOSIS — E538 Deficiency of other specified B group vitamins: Secondary | ICD-10-CM | POA: Diagnosis not present

## 2018-03-12 DIAGNOSIS — I5022 Chronic systolic (congestive) heart failure: Secondary | ICD-10-CM | POA: Diagnosis not present

## 2018-03-12 DIAGNOSIS — E1142 Type 2 diabetes mellitus with diabetic polyneuropathy: Secondary | ICD-10-CM | POA: Diagnosis not present

## 2018-03-12 LAB — POCT CBC
Granulocyte percent: 42.9 %G (ref 37–80)
HCT, POC: 47.8 % — AB (ref 29–41)
Hemoglobin: 16.2 g/dL — AB (ref 11–14.6)
Lymph, poc: 2.1 (ref 0.6–3.4)
MCH, POC: 31 pg (ref 27–31.2)
MCHC: 33.8 g/dL (ref 31.8–35.4)
MCV: 91.7 fL (ref 76–111)
MID (cbc): 0.2 (ref 0–0.9)
MPV: 8.1 fL (ref 0–99.8)
POC Granulocyte: 1.7 — AB (ref 2–6.9)
POC LYMPH PERCENT: 52.3 %L — AB (ref 10–50)
POC MID %: 4.8 %M (ref 0–12)
Platelet Count, POC: 242 10*3/uL (ref 142–424)
RBC: 5.21 M/uL (ref 4.69–6.13)
RDW, POC: 13.3 %
WBC: 4 10*3/uL — AB (ref 4.6–10.2)

## 2018-03-12 LAB — POCT URINALYSIS DIP (MANUAL ENTRY)
Bilirubin, UA: NEGATIVE
Blood, UA: NEGATIVE
GLUCOSE UA: NEGATIVE mg/dL
Ketones, POC UA: NEGATIVE mg/dL
Leukocytes, UA: NEGATIVE
Nitrite, UA: NEGATIVE
Protein Ur, POC: NEGATIVE mg/dL
Spec Grav, UA: 1.02 (ref 1.010–1.025)
UROBILINOGEN UA: 0.2 U/dL
pH, UA: 5.5 (ref 5.0–8.0)

## 2018-03-12 LAB — POC MICROSCOPIC URINALYSIS (UMFC): Mucus: ABSENT

## 2018-03-12 LAB — POCT GLYCOSYLATED HEMOGLOBIN (HGB A1C): Hemoglobin A1C: 6.7 % — AB (ref 4.0–5.6)

## 2018-03-12 MED ORDER — BLOOD GLUCOSE MONITOR KIT: PACK | 0 refills | Status: DC

## 2018-03-12 NOTE — Patient Instructions (Addendum)
I am suspicious that perhaps your body successfully fought off a viral infection - maybe it was the flu which you didn't have symptoms off since you were vaccinated.  Everything looks great and I will let you know if I see any other clues or concerns by all the blood work we did today. We will also fax results of your labs to your [facility] doctors but please bring this with you to your [facility] appointment as it has a copy of the EKG and your in house labs.  If you have lab work done today you will be contacted with your lab results within the next 2 weeks.  If you have not heard from [place] then please contact us. The fastest way to get your results is to register for My Chart.   IF you received an x-ray today, you will receive an invoice from [facility]. Please contact [facility] at [phone] with questions or concerns regarding your invoice.   IF you received labwork today, you will receive an invoice from [facility]. Please contact [facility] at [phone] with questions or concerns regarding your invoice.   Our billing staff will not be able to assist you with questions regarding bills from these companies.  You will be contacted with the lab results as soon as they are available. The fastest way to get your results is to activate your My Chart account. Instructions are located on the last page of this paperwork. If you have not heard from [place] regarding the results in 2 weeks, please contact this office.     Lymphadenopathy  Lymphadenopathy means that your lymph glands are swollen or larger than normal (enlarged). Lymph glands, also called lymph nodes, are collections of tissue that filter bacteria, viruses, and waste from your bloodstream. They are part of your body's disease-fighting system (immune system), which protects your body from germs. There may be different causes of lymphadenopathy, depending on where it is in your body. Some types go away on their own. Lymphadenopathy can  occur anywhere that you have lymph glands, including these areas:  Neck (cervical lymphadenopathy).  Chest (mediastinal lymphadenopathy).  Lungs (hilar lymphadenopathy).  Underarms (axillary lymphadenopathy).  Groin (inguinal lymphadenopathy). When your immune system responds to germs, infection-fighting cells and fluid build up in your lymph glands. This causes some swelling and enlargement. If the lymph glands do not go back to normal after you have an infection or disease, your health care provider may do tests. These tests help to monitor your condition and find the reason why the glands are still swollen and enlarged. Follow these instructions at home:  Get plenty of rest.  Take over-the-counter and prescription medicines only as told by your health care provider. Your health care provider may recommend over-the-counter medicines for pain.  If directed, apply heat to swollen lymph glands as often as told by your health care provider. Use the heat source that your health care provider recommends, such as a moist heat pack or a heating pad. ? Place a towel between your skin and the heat source. ? Leave the heat on for 20-30 minutes. ? Remove the heat if your skin turns bright red. This is especially important if you are unable to feel pain, heat, or cold. You may have a greater risk of getting burned.  Check your affected lymph glands every day for changes. Check other lymph gland areas as told by your health care provider. Check for changes such as: ? More swelling. ? Sudden increase in  size. ? Redness or pain. ? Hardness.  Keep all follow-up visits as told by your health care provider. This is important. Contact a health care provider if you have:  Swelling that gets worse or spreads to other areas.  Problems with breathing.  Lymph glands that: ? Are still swollen after 2 weeks. ? Have suddenly gotten bigger. ? Are red, painful, or hard.  A fever or  chills.  Fatigue.  A sore throat.  Pain in your abdomen.  Weight loss.  Night sweats. Get help right away if you have:  Fluid leaking from an enlarged lymph gland.  Severe pain.  Chest pain.  Shortness of breath. Summary  Lymphadenopathy means that your lymph glands are swollen or larger than normal (enlarged).  Lymph glands (also called lymph nodes) are collections of tissue that filter bacteria, viruses, and waste from the bloodstream. They are part of your body's disease-fighting system (immune system).  Lymphadenopathy can occur anywhere that you have lymph glands.  If your enlarged and swollen lymph glands do not go back to normal after you have an infection or disease, your health care provider may do tests to monitor your condition and find the reason why the glands are still swollen and enlarged.  Check your affected lymph glands every day for changes. Check other lymph gland areas as told by your health care provider. This information is not intended to replace advice given to you by your health care provider. Make sure you discuss any questions you have with your health care provider. Document Released: 12/18/2007 Document Revised: 01/23/2017 Document Reviewed: 01/23/2017 Elsevier Interactive Patient Education  2019 [employer].

## 2018-03-12 NOTE — Progress Notes (Signed)
Subjective:    Patient: Michael Roberts  DOB: Jun 25, 1945; 72 y.o.   MRN: [id number]  Chief Complaint  Patient presents with  . Weight Loss    pt states he has been having some weight loss and was told by his homehealth doctor he need to be seen. Pt states he was diagnosed with Stage 2 prostate cancer in April   . Lymphadenopathy    x 2 weeks     HPI Prostate PSA increased to 7 and then it increased to 9 - his [facility] PCP reassured him that it was nothing, then he was seeing someone else who noted it and told him that he better get that checked out - so was referred to [facility] Dr. Tresa Roberts who told him it was low grade after biopsy - they recommended surveillance although pt was originally thinking he would do radiation. Started on finasteride which cuts PSA in half In the weekj of Nov noticed swelling of lymphnodes in inguinal area and anterior axilla under chest and up into neck and lost 9 lbs within a week - was at 238 down to 229, up to 235.8.  He was trying to eat a lot more during the week, he wondered if he was also constipated He called [facility] and the nurse said don't worry about it, see PCP as must be something else, he doesn't need to be seen for 6 wks So he got a second opinion and Dr. Alinda Roberts agreed ot get bone scan and and CT scan which is scheduled for 1/7.  Ate today at 12 o'clock. No SHoB, no DOE. No pedal edema, no orthopenea, not sure aobut overlying skin changes, no tenderness. Fatigued when looking weight so made himself do his walk 3x/wk and then he pushed trough and started getting beter w/ exercise. No rash, no erythea, no pharyngitis. No recently URI.  Bowels ok now - he will have a BM the day after a walk.  Plenty of protein, steamed veggies. CBG machine broken Did get the flu shot in early October  Seeing Michael Roberts [facility] 1/29  Medical History Past Medical History:  Diagnosis Date  . Cataract 2018   developing in right  . Chronic systolic CHF  (congestive heart failure) (HCC)    a. EF 30% by echo in 2010  . Diabetes mellitus without complication ([place])   . HLD (hyperlipidemia)   . HTN (hypertension)   . Myocardial infarction ([place])   . Stroke [facility])    Past Surgical History:  Procedure Laterality Date  . CARDIAC CATHETERIZATION N/A 06/22/2015   Procedure: Left Heart Cath and Coronary Angiography;  Surgeon: Michael Blanks, MD;  Location: [facility];  Service: Cardiovascular;  Laterality: N/A;   Current Outpatient Medications on File Prior to Visit  Medication Sig Dispense Refill  . apixaban (ELIQUIS) 5 MG TABS tablet Take 1 tablet (5 mg total) by mouth 2 (two) times daily. 60 tablet 0  . carvedilol (COREG) 6.25 MG tablet Take 1 tablet (6.25 mg total) by mouth 2 (two) times daily with a meal. 60 tablet 0  . cholecalciferol (VITAMIN D) 1000 units tablet Take 2,000 Units by mouth daily.    . finasteride (PROSCAR) 5 MG tablet Take 5 mg by mouth daily.    . fluticasone (FLONASE) 50 MCG/ACT nasal spray Place 2 sprays into both nostrils at bedtime. 16 g 2  . glucosamine-chondroitin 500-400 MG tablet Take 1 tablet by mouth 3 (three) times daily.    Marland Kitchen  hydrochlorothiazide (HYDRODIURIL) 25 MG tablet Take 25 mg by mouth daily.    . metFORMIN (GLUCOPHAGE) 500 MG tablet Take by mouth 2 (two) times daily with a meal.    . Multiple Vitamins-Minerals (MULTIVITAMIN WITH IRON-MINERALS) liquid Take by mouth daily.    . sacubitril-valsartan (ENTRESTO) 49-51 MG Take 1 tablet by mouth 2 (two) times daily. 60 tablet 0  . simvastatin (ZOCOR) 20 MG tablet Take 20 mg by mouth daily.    Marland Kitchen spironolactone (ALDACTONE) 25 MG tablet Take 0.5 tablets (12.5 mg total) by mouth daily. 30 tablet 0  . vitamin A 7500 UNIT capsule Take 1,200 Units by mouth daily.     Current Facility-Administered Medications on File Prior to Visit  Medication Dose Route Frequency Provider Last Rate Last Dose  . cyanocobalamin ((VITAMIN B-12)) injection 1,000 mcg  1,000  mcg Intramuscular Q30 days Michael Knapp, MD   1,000 mcg at 02/09/17 1039   Allergies  Allergen Reactions  . Pollen Extract Other (See Comments)    sneezing   Family History  Problem Relation Age of Onset  . Diabetes Mother   . Heart attack Sister        Age 72  . Cancer Sister    Social History   Socioeconomic History  . Marital status: Married    Spouse name: Michael Roberts  . Number of children: Not on file  . Years of education: Not on file  . Highest education level: Not on file  Occupational History  . Not on file  Social Needs  . Financial resource strain: Not on file  . Food insecurity:    Worry: Not on file    Inability: Not on file  . Transportation needs:    Medical: Not on file    Non-medical: Not on file  Tobacco Use  . Smoking status: Never Smoker  . Smokeless tobacco: Never Used  Substance and Sexual Activity  . Alcohol use: Yes    Alcohol/week: 0.0 standard drinks    Comment: "drinks heavy" per wife  . Drug use: No  . Sexual activity: Not on file  Lifestyle  . Physical activity:    Days per week: Not on file    Minutes per session: Not on file  . Stress: Not on file  Relationships  . Social connections:    Talks on phone: Not on file    Gets together: Not on file    Attends religious service: Not on file    Active member of club or organization: Not on file    Attends meetings of clubs or organizations: Not on file    Relationship status: Not on file  Other Topics Concern  . Not on file  Social History Narrative   Wife is Michael Roberts   Depression screen [facility] 2/9 03/12/2018 02/09/2017 10/30/2016 10/02/2016 04/14/2016  Decreased Interest 0 0 0 0 0  Down, Depressed, Hopeless 0 0 0 0 0  PHQ - 2 Score 0 0 0 0 0  Altered sleeping - - - - -  Tired, decreased energy - - - - -  Change in appetite - - - - -  Feeling bad or failure about yourself  - - - - -  Trouble concentrating - - - - -  Moving slowly or fidgety/restless - - - - -  Suicidal  thoughts - - - - -  PHQ-9 Score - - - - -  Difficult doing work/chores - - - - -    ROS As  noted in HPI  Objective:  BP (!) 148/82   Pulse 71   Temp 98 F (36.7 C) (Oral)   Resp 16   Wt 239 lb (108.4 kg)   SpO2 97%   BMI 31.53 kg/m  Physical Exam Constitutional:      General: He is not in acute distress.    Appearance: He is well-developed. He is not diaphoretic.  HENT:     Head: Normocephalic and atraumatic.  Eyes:     General: No scleral icterus.    Conjunctiva/sclera: Conjunctivae normal.     Pupils: Pupils are equal, round, and reactive to light.  Neck:     Musculoskeletal: Normal range of motion and neck supple.     Thyroid: No thyromegaly.  Cardiovascular:     Rate and Rhythm: Normal rate and regular rhythm.     Heart sounds: Normal heart sounds.  Pulmonary:     Effort: Pulmonary effort is normal. No respiratory distress.     Breath sounds: Normal breath sounds.  Lymphadenopathy:     Head:     Right side of head: No preauricular, posterior auricular or occipital adenopathy.     Left side of head: No preauricular, posterior auricular or occipital adenopathy.     Cervical: Cervical adenopathy present.     Upper Body:     Right upper body: No supraclavicular, axillary, pectoral or epitrochlear adenopathy.     Left upper body: No supraclavicular, axillary, pectoral or epitrochlear adenopathy.     Lower Body: Right inguinal adenopathy present. Left inguinal adenopathy present.  Skin:    General: Skin is warm and dry.  Neurological:     Mental Status: He is alert and oriented to person, place, and time.  Psychiatric:        Behavior: Behavior normal.     [facility] TESTING Office Visit on 03/12/2018  Component Date Value Ref Range Status  . Color, UA 03/12/2018 yellow  yellow Final  . Clarity, UA 03/12/2018 clear  clear Final  . Glucose, UA 03/12/2018 negative  negative mg/dL Final  . Bilirubin, UA 03/12/2018 negative  negative Final  . Ketones, POC UA  03/12/2018 negative  negative mg/dL Final  . Spec Grav, UA 03/12/2018 1.020  1.010 - 1.025 Final  . Blood, UA 03/12/2018 negative  negative Final  . pH, UA 03/12/2018 5.5  5.0 - 8.0 Final  . Protein Ur, POC 03/12/2018 negative  negative mg/dL Final  . Urobilinogen, UA 03/12/2018 0.2  0.2 or 1.0 E.U./dL Final  . Nitrite, UA 03/12/2018 Negative  Negative Final  . Leukocytes, UA 03/12/2018 Negative  Negative Final  . WBC,UR,HPF,POC 03/12/2018 Few* None WBC/hpf Final  . RBC,UR,HPF,POC 03/12/2018 None  None RBC/hpf Final  . Bacteria 03/12/2018 Few* None, Too numerous to count Final  . Mucus 03/12/2018 Absent  Absent Final  . Epithelial Cells, UR Per Microscopy 03/12/2018 None  None, Too numerous to count cells/hpf Final  . Hemoglobin A1C 03/12/2018 6.7* 4.0 - 5.6 % Final  . WBC 03/12/2018 4.0* 4.6 - 10.2 K/uL Final  . Lymph, poc 03/12/2018 2.1  0.6 - 3.4 Final  . POC LYMPH PERCENT 03/12/2018 52.3* 10 - 50 %L Final  . MID (cbc) 03/12/2018 0.2  0 - 0.9 Final  . POC MID % 03/12/2018 4.8  0 - 12 %M Final  . POC Granulocyte 03/12/2018 1.7* 2 - 6.9 Final  . Granulocyte percent 03/12/2018 42.9  37 - 80 %G Final  . RBC 03/12/2018 5.21  4.69 - 6.13 M/uL  Final  . Hemoglobin 03/12/2018 16.2* 11 - 14.6 g/dL Final  . HCT, POC 03/12/2018 47.8* 29 - 41 % Final  . MCV 03/12/2018 91.7  76 - 111 fL Final  . MCH, POC 03/12/2018 31.0  27 - 31.2 pg Final  . MCHC 03/12/2018 33.8  31.8 - 35.4 g/dL Final  . RDW, POC 03/12/2018 13.3  % Final  . Platelet Count, POC 03/12/2018 242  142 - 424 K/uL Final  . MPV 03/12/2018 8.1  0 - 99.8 fL Final    EKG: NSR, no acute ischemic changes noted. only significant change noted when compared to prior EKG done 06/22/2015 is new first degree AV block w/ PR lengthening - most recent PRi was longest in Epic prior to today at 194 ms.   I have personally reviewed the EKG tracing and agree with the computer interpretation. Sinus  Rhythm  -First degree A-V block  PRi = 220 -Inferior  infarct -probably not recent  -Old anterior infarct  -Left axis secondary to infarct -consider anterior fascicular block.   ABNORMAL  Assessment & Plan:   1. Type 2 diabetes mellitus with diabetic polyneuropathy, without long-term current use of insulin ([place])   2. Loss of weight   3. Vitamin B12 deficiency   4. Chronic systolic heart failure ([place])   5. Lymphadenopathy     Patient will continue on current chronic medications other than changes noted above, so ok to refill when needed.   See after visit summary for patient specific instructions.  Orders Placed This Encounter  Procedures  . Comprehensive metabolic panel  . Microalbumin / creatinine urine ratio  . TSH  . Vitamin B12  . Brain natriuretic peptide  . Sedimentation rate  . C-reactive protein  . POCT urinalysis dipstick  . POCT Microscopic Urinalysis ([facility])  . POCT glycosylated hemoglobin (Hb A1C)  . POCT CBC  . EKG 12-Lead    Meds ordered this encounter  Medications  . blood glucose meter kit and supplies KIT    Sig: Dispense based on patient and insurance preference. Use up to four times daily as directed. (FOR ICD-10 E11.42).    Dispense:  1 each    Refill:  0    Order Specific Question:   Number of strips    Answer:   1000    Order Specific Question:   Number of lancets    Answer:   1000    Patient verbalized to me that they understand the following: diagnosis, what is being done for them, what to expect and what should be done at home.  Their questions have been answered. They understand that I am unable to predict every possible medication interaction or adverse outcome and that if any unexpected symptoms arise, they should contact us and their pharmacist, as well as never hesitate to seek urgent/emergent care at [facility] or ER if they think it might be warranted.    Delman Cheadle, MD, MPH [facility] [address] [place], [place]  [address] [phone] Office  phone  [phone] Office fax  03/12/18 3:32 PM

## 2018-03-13 LAB — MICROALBUMIN / CREATININE URINE RATIO
Creatinine, Urine: 104.3 mg/dL
Microalb/Creat Ratio: 24 mg/g creat (ref 0.0–30.0)
Microalbumin, Urine: 25 ug/mL

## 2018-03-13 LAB — COMPREHENSIVE METABOLIC PANEL
ALK PHOS: 84 IU/L (ref 39–117)
ALT: 22 IU/L (ref 0–44)
AST: 21 IU/L (ref 0–40)
Albumin/Globulin Ratio: 1.7 (ref 1.2–2.2)
Albumin: 4.7 g/dL (ref 3.5–4.8)
BUN/Creatinine Ratio: 18 (ref 10–24)
BUN: 18 mg/dL (ref 8–27)
Bilirubin Total: 0.4 mg/dL (ref 0.0–1.2)
CO2: 23 mmol/L (ref 20–29)
Calcium: 9.3 mg/dL (ref 8.6–10.2)
Chloride: 100 mmol/L (ref 96–106)
Creatinine, Ser: 1 mg/dL (ref 0.76–1.27)
GFR calc Af Amer: 87 mL/min/{1.73_m2} (ref >59)
GFR calc non Af Amer: 75 mL/min/{1.73_m2} (ref >59)
Globulin, Total: 2.7 g/dL (ref 1.5–4.5)
Glucose: 137 mg/dL — ABNORMAL HIGH (ref 65–99)
Potassium: 4.5 mmol/L (ref 3.5–5.2)
Sodium: 140 mmol/L (ref 134–144)
Total Protein: 7.4 g/dL (ref 6.0–8.5)

## 2018-03-13 LAB — BRAIN NATRIURETIC PEPTIDE: BNP: 42.2 pg/mL (ref 0.0–100.0)

## 2018-03-13 LAB — SEDIMENTATION RATE: Sed Rate: 15 mm/hr (ref 0–30)

## 2018-03-13 LAB — TSH: TSH: 1.73 u[IU]/mL (ref 0.450–4.500)

## 2018-03-13 LAB — C-REACTIVE PROTEIN: CRP: <1 mg/L (ref 0–10)

## 2018-03-13 LAB — VITAMIN B12: Vitamin B-12: 358 pg/mL (ref 232–1245)

## 2018-03-30 ENCOUNTER — Encounter (HOSPITAL_COMMUNITY)
Admission: RE | Admit: 2018-03-30 | Discharge: 2018-03-30 | Disposition: A | Discharge status: Home / Self Care | Payer: Medicare Other | Source: Ambulatory Visit | Attending: Urology | Admitting: Urology

## 2018-03-30 DIAGNOSIS — C61 Malignant neoplasm of prostate: Secondary | ICD-10-CM | POA: Diagnosis present

## 2018-03-30 MED ORDER — TECHNETIUM TC 99M MEDRONATE IV KIT
21.8000 | PACK | Freq: Once | INTRAVENOUS | Status: AC | PRN
  Administered 2018-03-30: 21.8 via INTRAVENOUS

## 2018-04-02 ENCOUNTER — Other Ambulatory Visit: Payer: Self-pay | Admitting: Urology

## 2018-04-02 DIAGNOSIS — C61 Malignant neoplasm of prostate: Secondary | ICD-10-CM

## 2018-04-13 ENCOUNTER — Ambulatory Visit
Admission: RE | Admit: 2018-04-13 | Discharge: 2018-04-13 | Disposition: A | Discharge status: Home / Self Care | Payer: Medicare Other | Source: Ambulatory Visit | Attending: Urology | Admitting: Urology

## 2018-04-13 DIAGNOSIS — C61 Malignant neoplasm of prostate: Secondary | ICD-10-CM

## 2018-04-13 MED ORDER — GADOBENATE DIMEGLUMINE 529 MG/ML IV SOLN
20.0000 mL | Freq: Once | INTRAVENOUS | Status: AC | PRN
  Administered 2018-04-13: 20 mL via INTRAVENOUS

## 2018-04-15 ENCOUNTER — Telehealth: Payer: Self-pay | Admitting: Family Medicine

## 2018-04-15 NOTE — Telephone Encounter (Signed)
LVM for pt to call the office and reschedule appt that was scheduled with Dr. Brigitte Pulse on 04/23/18. Due to Dr. Brigitte Pulse being out on emergency leave, pt can either schedule with a different provider or they may schedule with D. Brigitte Pulse in mid March. When pt calls back, please schedule accordingly. Thank you!

## 2018-04-23 ENCOUNTER — Encounter: Payer: Medicare Other | Admitting: Family Medicine

## 2018-04-23 ENCOUNTER — Telehealth: Payer: Self-pay | Admitting: Medical Oncology

## 2018-04-23 NOTE — Telephone Encounter (Signed)
I called pt to introduce myself as the Prostate Nurse Navigator and the Coordinator of the Pro[facility].  1. I confirmed with the patient he is aware of his referral to the clinic 05/07/18 arriving at 8:00 am.   2. I discussed the format of the clinic and the physicians he will be seeing that day.  3. I discussed where the clinic is located and how to contact me.  4. I confirmed his address and informed him I would be mailing a packet of information and forms to be completed. I asked him to bring them with him the day of his appointment.   He voiced understanding of the above. I asked him to call me if he has any questions or concerns regarding his appointments or the forms he needs to complete.

## 2018-05-04 ENCOUNTER — Encounter: Payer: Self-pay | Admitting: Medical Oncology

## 2018-05-06 ENCOUNTER — Encounter: Payer: Self-pay | Admitting: Radiation Oncology

## 2018-05-06 NOTE — Progress Notes (Signed)
GU Location of Tumor / Histology: prostatic adenocarcinoma  If Prostate Cancer, Gleason Score is (3 + 4) and PSA is (7.49). Decreased to 2.28 in October 2019 on finasteride. Prostate volume: 150 cc.   06/2017 TRUS BX  04/2017 PSA 6.95/DRE 70 gram smooth; PSA 7.49 in 05/2017 05/2016 PSA 9.1 supposed to recheck but no recheck done  2009 PSA 1.8  Biopsies of prostate (if applicable) revealed:    Past/Anticipated interventions by urology, if any: prostate biopsy, CT abd/pelvis, bone scan, MRI, referral to [facility]  Past/Anticipated interventions by medical oncology, if any: no  Weight changes, if any: yes, 10 lb in six months  Bowel/Bladder complaints, if any:    Nausea/Vomiting, if any: no  Pain issues, if any:    SAFETY ISSUES:  Prior radiation?   Pacemaker/ICD?  Possible current pregnancy? no, male patient  Is the patient on methotrexate? no  Current Complaints / other details:  73 year old male. NKDA. Married.

## 2018-05-07 ENCOUNTER — Encounter: Payer: Self-pay | Admitting: General Practice

## 2018-05-07 ENCOUNTER — Other Ambulatory Visit: Payer: Self-pay

## 2018-05-07 ENCOUNTER — Encounter: Payer: Self-pay | Admitting: Radiation Oncology

## 2018-05-07 ENCOUNTER — Ambulatory Visit
Admission: RE | Admit: 2018-05-07 | Discharge: 2018-05-07 | Disposition: A | Discharge status: Home / Self Care | Payer: Medicare Other | Source: Ambulatory Visit | Attending: Radiation Oncology | Admitting: Radiation Oncology

## 2018-05-07 ENCOUNTER — Encounter: Payer: Self-pay | Admitting: Medical Oncology

## 2018-05-07 VITALS — BP 144/90 | HR 65 | Temp 98.5°F | Resp 20 | Wt 245.0 lb

## 2018-05-07 DIAGNOSIS — Z87891 Personal history of nicotine dependence: Secondary | ICD-10-CM | POA: Insufficient documentation

## 2018-05-07 DIAGNOSIS — I5022 Chronic systolic (congestive) heart failure: Secondary | ICD-10-CM | POA: Insufficient documentation

## 2018-05-07 DIAGNOSIS — Z79899 Other long term (current) drug therapy: Secondary | ICD-10-CM | POA: Diagnosis not present

## 2018-05-07 DIAGNOSIS — Z7984 Long term (current) use of oral hypoglycemic drugs: Secondary | ICD-10-CM | POA: Diagnosis not present

## 2018-05-07 DIAGNOSIS — E119 Type 2 diabetes mellitus without complications: Secondary | ICD-10-CM | POA: Insufficient documentation

## 2018-05-07 DIAGNOSIS — C61 Malignant neoplasm of prostate: Secondary | ICD-10-CM

## 2018-05-07 DIAGNOSIS — Z7901 Long term (current) use of anticoagulants: Secondary | ICD-10-CM | POA: Diagnosis not present

## 2018-05-07 DIAGNOSIS — E785 Hyperlipidemia, unspecified: Secondary | ICD-10-CM | POA: Diagnosis not present

## 2018-05-07 DIAGNOSIS — I110 Hypertensive heart disease with heart failure: Secondary | ICD-10-CM | POA: Diagnosis not present

## 2018-05-07 DIAGNOSIS — I252 Old myocardial infarction: Secondary | ICD-10-CM | POA: Diagnosis not present

## 2018-05-07 HISTORY — DX: Malignant neoplasm of prostate: C61

## 2018-05-07 HISTORY — DX: Contact with and (suspected) exposure to other hazardous, chiefly nonmedicinal, chemicals: Z77.098

## 2018-05-07 NOTE — Progress Notes (Signed)
[facility] Psychosocial Distress Screening Spiritual Care  Met with Ruhaan and his wife in [facility] to introduce [facility] team/resources, reviewing distress screen per protocol.  The patient scored a 4 on the Psychosocial Distress Thermometer which indicates moderate distress. Also assessed for distress and other psychosocial needs.   ON[facility] DISTRESS SCREENING 05/07/2018  Screening Type Initial Screening  Distress experienced in past week (1-10) 4  Emotional problem type Adjusting to illness  Information Concerns Type Lack of info about treatment  Referral to support programs Yes    Per Mr Dancel, he is feeling relief and gratitude that his dx came early enough to be manageable. Participating in [facility] is resolving his information concerns. He and his wife plan to participate in Prostate Cancer Support Group as a means for reducing anxiety and building community.   Provided empathic listening, normalization of feelings, and information about Support Center team and programming resources, encouraging participation as means for coping and meaning-making through treatment and survivorship.    Follow up needed: No. Per couple, no other needs at this time, but please page if needs arise or circumstances change. Thank you.   [place], [place], [facility] Pager [phone] Voicemail [phone]

## 2018-05-07 NOTE — Progress Notes (Signed)
Radiation Oncology         [phone] ________________________________  Multidisciplinary Prostate Cancer Clinic  Initial Radiation Oncology Consultation  Name: Michael Roberts MRN: [id number]  Date: 05/07/2018  DOB: 14-Mar-1946  BM:WUXL, Michael Arrow, MD  Michael Bring, MD   REFERRING PHYSICIAN: Raynelle Bring, MD  DIAGNOSIS: 73 y.o. gentleman with stage T2a adenocarcinoma of the prostate with a Gleason's score of 3+4 and a PSA of 7.49    ICD-10-CM   1. Malignant neoplasm of prostate ([place]) [place] [name] is a 73 y.o. gentleman.  He was initiallynoted to have an elevated PSA of 9.1 by his primary care physician with the [place] in 2018 and was advised to have this evaluated further but did not follow through.  He was referred for evaluation in urology by Dr. Tresa Roberts in 04/2017,  digital rectal examination was performed at that time revealing no nodules and symmetrical lobes.   A repeat PSA at that time remained elevated at 6.95 and repeat PSA in 05/2017 was 7.49.  Accordingly, the patient proceeded to transrectal ultrasound with 12 biopsies of the prostate on 07/07/2017.  The prostate volume measured 156 cc.  Out of 12 core biopsies, 6 were positive.  The maximum Gleason score was 3+4, and this was seen in right base and left base. Gleason 3+3 was also seen in left base lateral, right mid, right apex, and right base lateral. Given his multiple medical comorbidities with DM II, HTN, Afib, TIA/CVA and CAD with prior MI, he elected to proceed with active surveillance.  He was started on finasteride due to his large prostate with BOO. A repeat PSA in 12/2017 was down to 2.28 (4.5 when corrected for finasteride). He did not appreciate any significant improvement in his LUTS with takind finasteride so he was also prescribed Flomax, in addition to the Finasteride in 12/2017.  He presented to Dr. Alinda Roberts for a second opinion on 03/02/2018 with concerns for metastatic disease due  to low back pain and lower abdominal/pelvic pain with decreased appetite and a 10 lb unintentional weight loss in 6 months. He proceeded with a CT abdomen/pelvis on 03/30/2018 for further evaluation of his complaints and this was without any findings suspicious for metastatic disease. A Bone scan was performed that same day and did show sites of increased uptake at the right ischium, right posterior 11th rib, and questionably additional bilateral ribs suspicious for osseous metastases. A pelvic MRI was performed on 04/13/2018 for further evaluation of the suspicious bone scan findings and this showed no compelling evidence for metastatic disease.  The patient reviewed the biopsy and imaging results with his urologist and he has kindly been referred today to the multidisciplinary prostate cancer clinic for presentation of pathology and radiology studies in our conference for discussion of potential radiation treatment options and clinical evaluation.   PREVIOUS RADIATION THERAPY: No  PAST MEDICAL HISTORY:  has a past medical history of Cataract (2440), Chronic systolic CHF (congestive heart failure) ([place]), Diabetes mellitus without complication ([place]), History of agent Orange exposure, HLD (hyperlipidemia), HTN (hypertension), Myocardial infarction [facility]), Prostate cancer ([place]), and Stroke ([place]).    PAST SURGICAL HISTORY: Past Surgical History:  Procedure Laterality Date  . CARDIAC CATHETERIZATION N/A 06/22/2015   Procedure: Left Heart Cath and Coronary Angiography;  Surgeon: Burnell Blanks, MD;  Location: [facility];  Service: Cardiovascular;  Laterality: N/A;  . PROSTATE BIOPSY      FAMILY HISTORY: family history  includes Cancer in his mother; Cancer (age of onset: 74) in his sister; Diabetes in his mother; Heart attack in his sister; Uterine cancer in his mother.  SOCIAL HISTORY:  reports that he quit smoking about 41 years ago. His smoking use included cigarettes. He has a 0.66  pack-year smoking history. He has never used smokeless tobacco. He reports current alcohol use of about 4.0 standard drinks of alcohol per week. He reports that he does not use drugs.  ALLERGIES: Pollen extract  MEDICATIONS:  Current Outpatient Medications  Medication Sig Dispense Refill  . apixaban (ELIQUIS) 5 MG TABS tablet Take 1 tablet (5 mg total) by mouth 2 (two) times daily. 60 tablet 0  . blood glucose meter kit and supplies KIT Dispense based on patient and insurance preference. Use up to four times daily as directed. (FOR ICD-10 E11.42). 1 each 0  . carvedilol (COREG) 6.25 MG tablet Take 1 tablet (6.25 mg total) by mouth 2 (two) times daily with a meal. 60 tablet 0  . cholecalciferol (VITAMIN D) 1000 units tablet Take 2,000 Units by mouth daily.    . finasteride (PROSCAR) 5 MG tablet Take 5 mg by mouth daily.    . fluticasone (FLONASE) 50 MCG/ACT nasal spray Place 2 sprays into both nostrils at bedtime. 16 g 2  . furosemide (LASIX) 40 MG tablet Take 40 mg by mouth.    . metFORMIN (GLUCOPHAGE) 500 MG tablet Take by mouth 2 (two) times daily with a meal.    . Multiple Vitamins-Minerals (MULTIVITAMIN WITH IRON-MINERALS) liquid Take by mouth daily.    . sacubitril-valsartan (ENTRESTO) 49-51 MG Take 1 tablet by mouth 2 (two) times daily. 60 tablet 0  . simvastatin (ZOCOR) 20 MG tablet Take 20 mg by mouth daily.    Marland Kitchen spironolactone (ALDACTONE) 25 MG tablet Take 0.5 tablets (12.5 mg total) by mouth daily. 30 tablet 0   Current Facility-Administered Medications  Medication Dose Route Frequency Provider Last Rate Last Dose  . cyanocobalamin ((VITAMIN B-12)) injection 1,000 mcg  1,000 mcg Intramuscular Q30 days Shawnee Knapp, MD   1,000 mcg at 02/09/17 1039    REVIEW OF SYSTEMS:  On review of systems, the patient reports that he is doing well overall. Per patient form, he reports wearing glasses, blurred vision, hearing loss, gum disease, irregular heartbeat, shortness of breath on exertion,  dribbling urine, hematuria, arthritis, depression, diabetes, and swollen lymph glands. His IPSS was 26, indicating severe urinary symptoms despite maximum medical therapy with 5-ARI and alpha blocker. His SHIM was 11, indicating he has moderate erectile dysfunction. A complete review of systems is obtained and is otherwise negative.   PHYSICAL EXAM:  Wt Readings from Last 3 Encounters:  05/07/18 245 lb (111.1 kg)  03/12/18 239 lb (108.4 kg)  02/09/17 247 lb (112 kg)   Temp Readings from Last 3 Encounters:  05/07/18 98.5 F (36.9 C) (Oral)  03/12/18 98 F (36.7 C) (Oral)  02/09/17 97.7 F (36.5 C)   BP Readings from Last 3 Encounters:  05/07/18 (!) 144/90  03/12/18 (!) 148/82  02/09/17 118/74   Pulse Readings from Last 3 Encounters:  05/07/18 65  03/12/18 71  02/09/17 62   Pain Assessment Pain Score: 0-No pain/10  In general this is a well appearing African American gentleman in no acute distress. He is alert and oriented x4 and appropriate throughout the examination. HEENT reveals that the patient is normocephalic, atraumatic. EOMs are intact. PERRLA. Skin is intact without any evidence of gross lesions.  Cardiovascular exam reveals a regular rate with irregular rhythm due to occasional skip beats, no clicks, rubs or murmurs are auscultated. Chest is clear to auscultation bilaterally. Lymphatic assessment is performed and does not reveal any adenopathy in the cervical, supraclavicular, axillary, or inguinal chains. Abdomen has active bowel sounds in all quadrants and is intact. The abdomen is soft, non tender, non distended. Lower extremities are negative for pretibial pitting edema, deep calf tenderness, cyanosis or clubbing.  KPS = 90  100 - Normal; no complaints; no evidence of disease. 90   - Able to carry on normal activity; minor signs or symptoms of disease. 80   - Normal activity with effort; some signs or symptoms of disease. 54   - Cares for self; unable to carry on  normal activity or to do active work. 60   - Requires occasional assistance, but is able to care for most of his personal needs. 50   - Requires considerable assistance and frequent medical care. 69   - Disabled; requires special care and assistance. 60   - Severely disabled; hospital admission is indicated although death not imminent. 2   - Very sick; hospital admission necessary; active supportive treatment necessary. 10   - Moribund; fatal processes progressing rapidly. 0     - Dead  Karnofsky DA, Abelmann [place], Craver LS and Burchenal JH [phone]) The use of the nitrogen mustards in the palliative treatment of carcinoma: with particular reference to bronchogenic carcinoma Cancer 1 [id number]   LABORATORY DATA:  Lab Results  Component Value Date   WBC 4.0 (A) 03/12/2018   HGB 16.2 (A) 03/12/2018   HCT 47.8 (A) 03/12/2018   MCV 91.7 03/12/2018   PLT 219 10/30/2016   Lab Results  Component Value Date   NA 140 03/12/2018   K 4.5 03/12/2018   CL 100 03/12/2018   CO2 23 03/12/2018   Lab Results  Component Value Date   ALT 22 03/12/2018   AST 21 03/12/2018   ALKPHOS 84 03/12/2018   BILITOT 0.4 03/12/2018     RADIOGRAPHY: Mr Pelvis W Wo Contrast  Result Date: 04/13/2018 CLINICAL DATA:  Prostate cancer. Abnormal bone scan with uptake in the right ischium. EXAM: MRI PELVIS WITHOUT AND WITH CONTRAST TECHNIQUE: Multiplanar multisequence MR imaging of the pelvis was performed both before and after administration of intravenous contrast. CONTRAST:  90m MULTIHANCE GADOBENATE DIMEGLUMINE 529 MG/ML IV SOLN Creatinine was [facility] [address] Results: Creatinine 1.1 mg/dL. COMPARISON:  Bone scan and CT abdomen pelvis dated March 30, 2018. FINDINGS: Bones: There is no evidence of acute fracture, dislocation or avascular necrosis. No focal bone lesion. Faint increased T2 signal in the right ischial tuberosity. The visualized sacroiliac joints and symphysis  pubis appear normal. Advanced lower lumbar facet arthropathy with degenerative marrow edema around the left L5-S1 facet. Articular cartilage and labrum Articular cartilage: Mild diffuse cartilage thinning in both hip joints. No significant subchondral signal abnormality. Labrum: Grossly intact, although evaluation is limited due to lack of intra-articular fluid. No paralabral abnormality. Joint or bursal effusion Joint effusion: No significant hip joint effusion. Bursae: No focal periarticular fluid collection. Muscles and tendons Muscles and tendons: Small partial tear of the right hamstring tendon origin. Small partial tear of the left gluteus medius tendon. The iliopsoas tendons are unremarkable. No muscle edema or atrophy. Other findings Miscellaneous: Enlarged prostate gland. Small spiculated T2 and T1 hypointensities in the left posterolateral bladder corresponding to the calcifications seen  on CT. Mild circumferential bladder wall thickening. IMPRESSION: 1. Although there is subtle patchy sclerosis in the right ischial tuberosity on CT, there is no focal bone lesion on MRI. Faint increased T2 signal in the right ischial tuberosity is likely reactive due to adjacent small partial tear of the right hamstring tendon origin, and may account for the increased uptake on bone scan. 2. Small partial tear of the left gluteus medius tendon. 3. Mild bilateral hip osteoarthritis. Electronically Signed   By: Titus Dubin M.D.   On: 04/13/2018 14:08      IMPRESSION/PLAN: 73 y.o. gentleman with Stage T2a adenocarcinoma of the prostate with a PSA of 7.49 and a Gleason score of 3+4.    We discussed the patient's workup and outlined the nature of prostate cancer in this setting. The patient's T stage, Gleason's score, and PSA put him into the favorable intermediate risk group. Accordingly, he is eligible for 5.5 weeks of external radiation or brachytherapy. We discussed the available radiation techniques, and focused  on the details and logistics and delivery. The patient is not an ideal candidate for brachytherapy boost with a prostate volume of 156 g with IPSS of 26 despite maximal medical therapy on flomax and finasteride. He is also not felt to be a good surgical candidate due t his multiple medical comorbidities.  Therefore, our recommendation is to proceed with a 5.5 week course of daily external beam radiotherapy. We discussed and outlined the risks, benefits, short and long-term effects associated with radiotherapy and compared and contrasted these with prostatectomy. We discussed the role of SpaceOAR in reducing the rectal toxicity associated with radiotherapy.  He will meet with Dr. Alinda Roberts today as well to discuss the potential option of starting ADT now, to allow time to coordinate a TURP with removal of bladder calculus to help improve his LUTS prior to starting XRT.  He could also have placement of fiducial markers and SpaceOAR gel at the time of TURP procedure in preparation for beginning prostate XRT approximately 2-3 months thereafter to allow time to recover from his procedure.  He was encouraged to ask questions that were answered to his stated satisfaction.  At the end of the conversation the patient is interested in moving forward with 5.5 weeks of external beam therapy. We will share our discussion with Dr. Alinda Roberts in anticipation of starting ADT now in anticipation of proceeding with scheduling TURP with bladder calculus removal and fiducial marker/SpaceOAR gel placement in the near future. He will be scheduled for CT simulation/treatment planning approximately 2-3 months thereafter in preparation to begin prostate IMRT.  He appears to have a good understanding of our recommendations and is in agreement with the stated plan.  We will remain in close communication with Dr. Alinda Roberts regarding coordination of care.     Nicholos Johns, PA-C    Tyler Pita, MD  [facility] Direct Dial: [phone]  Fax: [phone] [email or web address]  Skype  [email or web address]   This document serves as a record of services personally performed by Tyler Pita, MD and Freeman Caldron, PA-C. It was created on their behalf by Wilburn Mylar, a trained medical scribe. The creation of this record is based on the scribe's personal observations and the provider's statements to them. This document has been checked and approved by the attending provider.

## 2018-05-07 NOTE — Consult Note (Signed)
Multi-Disciplinary Clinic     05/07/2018     --------------------------------------------------------------------------------     Michael Roberts. Michael Roberts   MRN: [id number]  PRIMARY CARE:  Michael Roberts. Brigitte Pulse, MD   DOB: 05/29/1945, 73 year old Male  REFERRING:  Luvenia Redden   SSN: [id number]  PROVIDER:  Alexis Frock, M.D.     TREATING:  Raynelle Bring, M.D.     LOCATION:  [facility] [phone]     --------------------------------------------------------------------------------     CC/HPI: CC: Prostate Cancer     PCP: Dr. Dell Ponto   Location of consult: [facility] - Prostate Cancer Multidisciplinary Clinic     [name] is a 73 year old gentleman who was noted to have an elevated PSA of 7.49 prompting a TRUS biopsy of the prostate on 07/07/17 by Dr. Tresa Moore. This demonstrated Gleason 3+4=7 (prognostic grade group 2) adenocarcinoma of the prostate with 6 out of 12 biopsy cores positive for malignancy. He was thoroughly counseled by Dr. Tresa Moore and elected to proceed with active surveillance management considering his disease parameters and his significant medical comorbidities. He was started on finasteride due to his very large prostate and LUTS and his PSA decreased to 2.28 in October 2019. His lower urinary tract symptoms have improved on finasteride although remains significant. He does have a very large prostate gland measuring over 150 cc at the time of his biopsy.     He presented to me initially in December 2019 for a 2nd opinions with concerns about progression of his prostate cancer. He based this concern on feelings of pain and discomfort bilaterally across his lower abdomen that extended up his abdomen and into his neck. He was convinced that this is related to lymph node positive disease. He also has had some aches in his back. He had pain that extended down into his groin and medial thigh as well. He had lost approximately 10 lb over the past 6 months.  He admittedly was extremely anxious and concerned about the possibility of metastatic disease. At that visit, he stated that he no longer wished to proceed with active surveillance due to his severe anxiety about his cancer. We agreed to proceed with staging studies due to his concern and symptoms.     Family history: No prostate cancer. He has a sister who was diagnosed with breast cancer around age 60-40.     Imaging studies:   Bone scan (03/30/18): Uptake at right ischium, right posterior 11th rib, and questionable bilateral ribs.   CT abdomen and pelvis (03/30/18): 12 mm bladder calculus, no metastases   MRI (04/13/18): Subtle sclerosis of right ischium, no focal bone lesion, no definite metastatic disease     PMH: He has a history of hypertension, CHF, CAD with history of MI, atrial fibrillation, CVA, diabetes, and hyperlipidemia. He is on Eliquis. He is followed by cardiology at the [facility] in [place].   PSH: No abdominal surgeries.     TNM stage: cT2a Nx Mx (R medial apex/mid)   PSA: 7.49   Gleason score: 3+4=7   Biopsy (07/07/17): 6/12 cores positive   Left: L lateral base (5%, 3+3=6, PNI), L base (5%, 3+4=7)   Right: R apex (30%, 3+3=6), R mid (40%, 3+3=6), R base (10%, 3+4=7), R lateral base (< 5%, 3+3=6)   Prostate volume: 156 cc   PSAD: 0.05     Urinary function: IPSS is 26. He takes finasteride.   Erectile function: SHIM  score is 11. He does have severe erectile dysfunction but occasionally has responded to sildenafil.        ALLERGIES: No Allergies       MEDICATIONS: Finasteride 5 mg tablet 1 tablet PO Daily   Metformin Hcl 500 mg tablet   Simvastatin 20 mg tablet   Carvedilol 25 mg tablet   Eliquis 5 mg (74 tabs) tablet, dose pack   Intresto   Losartan Potassium 100 mg tablet   Spironolactone 25 mg tablet        GU PSH: Locm 300-399Mg /Ml Iodine,1Ml - 03/30/2018  Prostate Needle Biopsy - 07/07/2017         PSH Notes: Roberts Surgery, Leg Incision Removal Of  Foreign Body     NON-GU PSH: Roberts Arthroscopy/surgery, Left - 1994  Roberts Arthroscopy/surgery, Right  Leg surgery (unspecified), Left - 1998  Surgical Pathology, Gross And Microscopic Examination For Prostate Needle - 07/07/2017       GU PMH: Prostate Cancer (Chronic) - 07/20/2017         PMH Notes:   1898-03-24 00:00:00 - Note: Normal Routine History And Physical Adult     NON-GU PMH: Hypertension, Hypertension - 2014  Anxiety  Arthritis  Atrial Fibrillation  Congestive heart failure  Depression  Diabetes Type 2  Hypercholesterolemia  Myocardial Infarction  Sleep Apnea  Stroke/TIA       FAMILY HISTORY: Brain Cancer - Sister  Death In The Family Father - Father  Death In The Family Mother - Mother  Diabetes - Sister  Heart Attack - Sister  Hypertension - Sister  ovarian cancer - Mother  Uterine Cancer - Mother, Father     SOCIAL HISTORY: Marital Status: Married  Preferred Language: English; Ethnicity: Not Hispanic Or Latino; Race: Black or African American  Current Smoking Status: Patient does not smoke anymore. Has not smoked since 04/24/1977. Smoked for 2 years. Smoked less than 1/2 pack per day.     Tobacco Use Assessment Completed: Used Tobacco in last 30 days?  Does not drink anymore.   Drinks 3 caffeinated drinks per day.  Patient's occupation is/was retired.       REVIEW OF SYSTEMS:     GU Review Male:   Patient denies frequent urination, hard to postpone urination, burning/ pain with urination, get up at night to urinate, leakage of urine, stream starts and stops, trouble starting your streams, and have to strain to urinate .   Gastrointestinal (Lower):   Patient denies diarrhea and constipation.   Gastrointestinal (Upper):   Patient denies nausea and vomiting.   Constitutional:   Patient denies fever, night sweats, weight loss, and fatigue.   Skin:   Patient denies skin rash/ lesion and itching.   Eyes:   Patient denies blurred vision and double vision.    Ears/ Nose/ Throat:   Patient denies sore throat and sinus problems.   Hematologic/Lymphatic:   Patient denies easy bruising and swollen glands.   Cardiovascular:   Patient denies leg swelling and chest pains.   Respiratory:   Patient denies cough and shortness of breath.   Endocrine:   Patient denies excessive thirst.   Musculoskeletal:   Patient denies back pain and joint pain.   Neurological:   Patient denies headaches and dizziness.   Psychologic:   Patient denies depression and anxiety.     VITAL SIGNS: None     GU PHYSICAL EXAMINATION:     Prostate: Prostate about 80 grams. Left lobe normal consistency, right lobe normal consistency. Symmetrical  lobes. No prostate nodule. Left lobe no tenderness, right lobe no tenderness.      MULTI-SYSTEM PHYSICAL EXAMINATION:     Constitutional: Well-nourished. No physical deformities. Normally developed. Good grooming.   Respiratory: No labored breathing, no use of accessory muscles. Clear bilaterally.   Cardiovascular: Normal temperature, normal extremity pulses, no swelling, no varicosities. Irregular rhythm, normal rate.        PAST DATA REVIEWED:   Source Of History:  Patient   Lab Test Review:   PSA   Records Review:   Pathology Reports, Previous Patient Records   Urine Test Review:   Urinalysis   X-Ray Review: C.T. Pelvis: Reviewed Films.   MRI Pelvis: Reviewed Films.   Bone Scan: Reviewed Films.       01/11/18 06/01/17 05/04/17 06/08/07 11/01/03   PSA   Total PSA 2.28 ng/mL 7.49 ng/mL 6.95 ng/mL 1.84  2.12    Free PSA  0.99 ng/mL 0.92 ng/mL     % Free PSA  13 % PSA 13 % PSA       Notes:                     CLINICAL DATA: Prostate cancer. Abnormal bone scan with uptake in   the right ischium.       EXAM:   MRI PELVIS WITHOUT AND WITH CONTRAST       TECHNIQUE:   Multiplanar multisequence MR imaging of the pelvis was performed   both before and after administration of intravenous contrast.       CONTRAST: 69mL MULTIHANCE  GADOBENATE DIMEGLUMINE 529 MG/ML IV SOLN       Creatinine was obtained on site at [facility] at [address]       Results: Creatinine 1.1 mg/dL.       COMPARISON: Bone scan and CT abdomen pelvis dated March 30, 2018.       FINDINGS:   Bones: There is no evidence of acute fracture, dislocation or   avascular necrosis. No focal bone lesion. Faint increased T2 signal   in the right ischial tuberosity. The visualized sacroiliac joints   and symphysis pubis appear normal. Advanced lower lumbar facet   arthropathy with degenerative marrow edema around the left L5-S1   facet.       Articular cartilage and labrum       Articular cartilage: Mild diffuse cartilage thinning in both hip   joints. No significant subchondral signal abnormality.       Labrum: Grossly intact, although evaluation is limited due to lack   of intra-articular fluid. No paralabral abnormality.       Joint or bursal effusion       Joint effusion: No significant hip joint effusion.       Bursae: No focal periarticular fluid collection.       Muscles and tendons       Muscles and tendons: Small partial tear of the right hamstring   tendon origin. Small partial tear of the left gluteus medius tendon.   The iliopsoas tendons are unremarkable. No muscle edema or atrophy.       Other findings       Miscellaneous: Enlarged prostate gland. Small spiculated T2 and T1   hypointensities in the left posterolateral bladder corresponding to   the calcifications seen on CT. Mild circumferential bladder wall   thickening.       IMPRESSION:   1. Although there is subtle patchy sclerosis in the  right ischial   tuberosity on CT, there is no focal bone lesion on MRI. Faint   increased T2 signal in the right ischial tuberosity is likely   reactive due to adjacent small partial tear of the right hamstring   tendon origin, and may account for the increased uptake on bone   scan.   2. Small partial tear of  the left gluteus medius tendon.   3. Mild bilateral hip osteoarthritis.           Electronically Signed   By: Titus Dubin M.D.   On: 04/13/2018 14:08     CLINICAL DATA: Prostate cancer, PSA 2.28       EXAM:   NUCLEAR MEDICINE WHOLE BODY BONE SCAN       TECHNIQUE:   Whole body anterior and posterior images were obtained approximately   3 hours after intravenous injection of radiopharmaceutical.       RADIOPHARMACEUTICALS: 21.8 mCi Technetium-9m MDP IV       COMPARISON: None       Radiographic correlation: CT abdomen and pelvis 03/30/2018       FINDINGS:   Uptake at the shoulders, knees, and feet, typically degenerative.       Focal abnormal increased tracer accumulation is seen at the RIGHT   ischium, posterior RIGHT eleventh rib, and at a lateral mid RIGHT   rib approximately seventh.       Questionable increased tracer localization posterolateral LEFT   eighth rib.       Unable to exclude metastases at the sites.       Subtle focus of sclerosis identified at the posterior RIGHT eleventh   rib image 27 on CT.       Probable subtle focus of sclerosis at the RIGHT ischium on CT image   87.       No additional sites of abnormal osseous tracer accumulation are   seen.       Expected urinary tract and soft tissue distribution of tracer.       IMPRESSION:   Sites of increased tracer accumulation at the RIGHT ischium, RIGHT   posterior eleventh rib, and questionably additional BILATERAL ribs   suspicious for osseous metastases as above.           Electronically Signed   By: Lavonia Dana M.D.   On: 03/30/2018 16:50     CLINICAL DATA: Prostate cancer, diagnosed April 2019, active   surveillance.     EXAM:   CT ABDOMEN AND PELVIS WITH CONTRAST     TECHNIQUE:   Multidetector CT imaging of the abdomen and pelvis was performed   using the standard protocol following bolus administration of   intravenous contrast.     CONTRAST: 100 mL Omnipaque 300 IV      COMPARISON: None.     FINDINGS:   Lower chest: Lung bases are clear.     Hepatobiliary: Liver is within normal limits.     Layering 4 mm gallstone (series 2/image 25). No intrahepatic or   extrahepatic ductal dilatation.     Pancreas: Within normal limits.     Spleen: Within normal limits.     Adrenals/Urinary Tract: Adrenal glands are within normal limits.     Kidneys are within normal limits. No enhancing renal lesions.     12 mm left posterolateral bladder calculus at the left ureteral   orifice (series 2/image 70). No hydronephrosis.     Thick-walled bladder, although underdistended.     Stomach/Bowel: Stomach is  within normal limits.     No evidence of bowel obstruction.     Normal appendix (series 2/image 55).     Vascular/Lymphatic: No evidence of abdominal aortic aneurysm.     Atherosclerotic calcifications of the abdominal aorta and branch   vessels.     No suspicious abdominopelvic lymphadenopathy.     Reproductive: Prostatomegaly in this patient with known prostate   cancer.     Other: No abdominopelvic ascites.     Musculoskeletal: Degenerative changes of the visualized   thoracolumbar spine, most prominent at L3-4.     No focal osseous lesions.     IMPRESSION:   Prostatomegaly in this patient with known prostate cancer.     No findings suspicious for metastatic disease. Correlate with   pending bone scan.     Thick-walled bladder, likely reflecting chronic bladder outlet   obstruction.     12 mm left posterolateral bladder calculus at the left ureteral   orifice. No hydronephrosis.     Layering 4 mm gallstone. No associated inflammatory changes.       Electronically Signed   By: Julian Hy M.D.   On: 03/30/2018 10:03      PROCEDURES: None     ASSESSMENT:       ICD-10 Details   1 GU:   Prostate Cancer - C61    2   BPH w/LUTS - N40.1    3   Nocturia - R35.1      PLAN:             Document  Letter(s):  Created for Patient:  Clinical Summary            Notes:   1. Prostate cancer: We reviewed his imaging studies and he is reassured that he does not appear to have metastatic disease after reviewing his studies with radiology today. He adamantly does not want to continue with active surveillance management. The patient was counseled about the natural history of prostate cancer and the standard treatment options that are available for prostate cancer. It was explained to him how his age and life expectancy, clinical stage, Gleason score, and PSA affect his prognosis, the decision to proceed with additional staging studies, as well as how that information influences recommended treatment strategies. We discussed the roles for active surveillance, radiation therapy, surgical therapy, androgen deprivation, as well as ablative therapy options for the treatment of prostate cancer as appropriate to his individual cancer situation. We discussed the risks and benefits of these options with regard to their impact on cancer control and also in terms of potential adverse events, complications, and impact on quality of life particularly related to urinary and sexual function. The patient was encouraged to ask questions throughout the discussion today and all questions were answered to his stated satisfaction. In addition, the patient was provided with and/or directed to appropriate resources and literature for further education about prostate cancer and treatment options.     Considering his significant medical comorbidities, I did not recommend surgical therapy. He is most interested in radiation therapy. He will see Dr. Tammi Klippel later this morning to discuss this further.     2. BPH/LUTS: We discussed his situation and his desire to proceed with treatment of his prostate cancer with radiation therapy. Considering that his symptoms remains severe despite almost 1 year of finasteride and he did not derive benefit from alpha blocker therapy, we  discussed the potential risk of worsening LUTS after radiation therapy and the risks involved  with treatment post-radiotherapy. Ideally, these symptoms would be addressed prior to radiation.     We therefore discussed placing him on androgen deprivation and considering a limited TURP. Although not ideal considering his prostate size, he is not a good candidate for a more major surgical intervention considering his medical comorbidities. His EF was only 25-30% on his echocardiogram in 2017. I will proceed with requesting cardiac risk assessment via his cardiologist at the [facility] to determine his risk for TURP. He would need to discontinue his anticoagulation for a up to a couple of weeks. The goal would be to improve his voiding symptoms prior to radiation therapy while maintaining him on ADT in the meantime considering his significant concern about his prostate cancer/development of metastatic disease despite my reassurances. If his cardiologist feels his risk is significant to undergo even a TURP or to come off Eliquis for a temporary period of time, we will need to further discuss his approach here as active surveillance is a very reasonable option clinically in this situation. We have reviewed the potential risks and complications of TURP today in detail.       Current the plan is for him to:   1) Start ADT   2) Get cardiac clearance for TURP if appropriate   3) Proceed with TURP plus SpaceOAR insertion/fiducial marker placement (will need to stop Eliquis perioperatively)   4) Recover from TURP   5) Proceed with EBRT     CC: Dr. Delman Cheadle   Dr. Tyler Pita          Next Appointment:       Next Appointment: 07/12/2018 02:00 PM     Appointment Type: Laboratory Appointment     Location: [facility] [phone]     Provider: Lab LAB     Reason for Visit: psa-bmp-manny          E & M CODE: I spent at least 48 minutes face to face with the patient, more than 50% of that time  was spent on counseling and/or coordinating care.

## 2018-05-07 NOTE — Progress Notes (Signed)
                               Care Plan Summary  Name: [name] DOB: December 07, 1945   Your Medical Team:   Urologist -  Dr. Raynelle Bring, [facility]  Radiation Oncologist - Dr. Tyler Pita, [facility]   Medical Oncologist - Dr. Zola Button, [place]  Recommendations: 1) Androgen deprivation (hormone injection) 2) TURP-procedure to improve urinary symptoms, placement of fiducial markers and SpaceOAR 3) Radiation  * These recommendations are based on information available as of today's consult.      Recommendations may change depending on the results of further tests or exams.   Next Steps: 1) Dr. Lynne Logan office will schedule hormone injection    When appointments need to be scheduled, you will be contacted by [facility] and/or [facility].  Patient provided with business cards for all team members and a copy of "Fall Prevention Patient Safety Sheet".  Questions?  Please do not hesitate to call Cira Rue, RN, BSN, OCN at [phone] any questions or concerns.  Shirlean Mylar is your Oncology Nurse Navigator and is available to assist you while you're receiving your medical care at [facility].

## 2018-05-08 DIAGNOSIS — C61 Malignant neoplasm of prostate: Secondary | ICD-10-CM | POA: Insufficient documentation

## 2018-05-17 ENCOUNTER — Telehealth: Payer: Self-pay | Admitting: Medical Oncology

## 2018-05-17 NOTE — Telephone Encounter (Signed)
Spoke with Michael Roberts as follow up to [facility]. He states he has not heard from  the [place] regarding cardiac assessment or from Michael Roberts  office for an appointment to receive  androgen deprivation. I informed him I will follow up with Dr. Alinda Roberts.  He had questions about the surgery that Dr. Alinda Roberts is planning to do. We discussed the TURP/ fiducial markers and SpaceOar. He voiced understanding. I encourage him to call  with questions or concerns.

## 2018-06-03 ENCOUNTER — Telehealth: Payer: Self-pay | Admitting: Medical Oncology

## 2018-06-03 NOTE — Telephone Encounter (Signed)
Spoke with Michael Roberts to see if he has heard from the [place] regarding cardiac clearance for TURP. He states he has not and he has not heard from Dr. Lynne Logan office about hormone injection. I will follow up with Dr. Alinda Money and give him a return call. He voiced understanding. I sent message to Dr. Alinda Money for an update.

## 2018-06-03 NOTE — Telephone Encounter (Signed)
Spoke with patient to inform him Dr. Alinda Money did obtain cardiac clearance from the [facility] today. The androgen deprivation has been pre-approved and Dr. Lynne Logan office will call him with an appointment for the injection. He voiced understanding.

## 2018-06-25 ENCOUNTER — Telehealth: Payer: Self-pay | Admitting: Medical Oncology

## 2018-06-25 NOTE — Telephone Encounter (Signed)
Spoke with Michael Roberts to see if he has received injection. He states that Dr. Lynne Logan office did schedule but due to his allergies and COVID-19 precautions he cancelled and plans to reschedule in the near future. I will notify Dr.Manning of the above.

## 2018-07-19 DIAGNOSIS — Z5111 Encounter for antineoplastic chemotherapy: Secondary | ICD-10-CM | POA: Diagnosis not present

## 2018-07-20 ENCOUNTER — Encounter: Payer: Self-pay | Admitting: Medical Oncology

## 2018-07-23 ENCOUNTER — Other Ambulatory Visit: Payer: Self-pay | Admitting: Urology

## 2018-07-29 ENCOUNTER — Other Ambulatory Visit: Payer: Self-pay | Admitting: Urology

## 2018-08-04 ENCOUNTER — Encounter: Payer: Self-pay | Admitting: Urology

## 2018-08-04 NOTE — Progress Notes (Signed)
Confirmed with Dr. Alinda Money that patient will have TURP at time of FM/SO on 09/13/18 so we will need to wait 6-8 weeks after TURP procedure prior to starting XRT. CT SIM scheduled for 10/22/18 in anticipation of beginning his treatment the week of 11/01/18.   Nicholos Johns, MMS, PA-C [place] [facility] [place]: [phone]  Fax: [phone]

## 2018-08-10 ENCOUNTER — Other Ambulatory Visit: Payer: Self-pay | Admitting: Urology

## 2018-08-10 DIAGNOSIS — C61 Malignant neoplasm of prostate: Secondary | ICD-10-CM

## 2018-09-08 NOTE — Patient Instructions (Addendum)
Michael Roberts    Your procedure is scheduled on: 09-13-2018   Report to [facility] Main  Entrance  Report to admitting at 850 AM   Michael Roberts 19 TEST ON_6/18______ @_______ , THIS TEST MUST BE DONE BEFORE SURGERY, COME TO [place]. ONCE YOUR COVID TEST IS COMPLETED, PLEASE BEGIN THE QUARANTINE INSTRUCTIONS AS OUTLINED IN YOUR HANDOUT.   Call this number if you have problems the morning of surgery [phone]    Remember: Do not eat food or drink liquids :After Midnight. BRUSH YOUR TEETH MORNING OF SURGERY AND RINSE YOUR MOUTH OUT, NO CHEWING GUM CANDY OR MINTS.     Take these medicines the morning of surgery with A SIP OF WATER: carvedilol (coreg), finasteride (proscar),   DO NOT TAKE ANY DIABETIC MEDICATIONS DAY OF YOUR SURGERY         How to Manage Your Diabetes Before and After Surgery  Why is it important to control my blood sugar before and after surgery? . Improving blood sugar levels before and after surgery helps healing and can limit problems. . A way of improving blood sugar control is eating a healthy diet by: o  Eating less sugar and carbohydrates o  Increasing activity/exercise o  Talking with your doctor about reaching your blood sugar goals . High blood sugars (greater than 180 mg/dL) can raise your risk of infections and slow your recovery, so you will need to focus on controlling your diabetes during the weeks before surgery. . Make sure that the doctor who takes care of your diabetes knows about your planned surgery including the date and location.  How do I manage my blood sugar before surgery? . Check your blood sugar at least 4 times a day, starting 2 days before surgery, to make sure that the level is not too high or low. o Check your blood sugar the morning of your surgery when you wake up and every 2 hours until you get to the Short Stay unit. . If your blood sugar is less than 70  mg/dL, you will need to treat for low blood sugar: o Do not take insulin. o Treat a low blood sugar (less than 70 mg/dL) with  cup of clear juice (cranberry or apple), 4 glucose tablets, OR glucose gel. o Recheck blood sugar in 15 minutes after treatment (to make sure it is greater than 70 mg/dL). If your blood sugar is not greater than 70 mg/dL on recheck, call [phone] for further instructions. . Report your blood sugar to the short stay nurse when you get to Short Stay.  . If you are admitted to the hospital after surgery: o Your blood sugar will be checked by the staff and you will probably be given insulin after surgery (instead of oral diabetes medicines) to make sure you have good blood sugar levels. o The goal for blood sugar control after surgery is 80-180 mg/dL.   WHAT DO I DO ABOUT MY DIABETES MEDICATION?  Marland Kitchen Do not take oral diabetes medicines (pills) the morning of surgery.  . THE DAY BEFORE SURGERY TAKE METFORMIN AS USUAL.       . THE MORNING OF SURGERY, DO NOT TAKE METFORMIN  Reviewed and Endorsed by [facility] Patient Education Committee, August 2015  You may not have any metal on your body including hair pins and              piercings  Do not wear jewelry, make-up, lotions, powders or perfumes, deodorant                          Men may shave face and neck.   Do not bring valuables to the hospital. Michael Roberts.  Contacts, dentures or bridgework may not be worn into surgery.  Leave suitcase in the car. After surgery it may be brought to your room.      _____________________________________________________________________             [facility] - Preparing for Surgery Before surgery, you can play an important role.  Because skin is not sterile, your skin needs to be as free of germs as possible.  You can reduce the number of germs on your skin by washing with CHG (chlorahexidine  gluconate) soap before surgery.  CHG is an antiseptic cleaner which kills germs and bonds with the skin to continue killing germs even after washing. Please DO NOT use if you have an allergy to CHG or antibacterial soaps.  If your skin becomes reddened/irritated stop using the CHG and inform your nurse when you arrive at Short Stay. Do not shave (including legs and underarms) for at least 48 hours prior to the first CHG shower.  You may shave your face/neck. Please follow these instructions carefully:  1.  Shower with CHG Soap the night before surgery and the  morning of Surgery.  2.  If you choose to wash your hair, wash your hair first as usual with your  normal  shampoo.  3.  After you shampoo, rinse your hair and body thoroughly to remove the  shampoo.                                       4.  Use CHG as you would any other liquid soap.  You can apply chg directly  to the skin and wash                       Gently with a scrungie or clean washcloth.  5.  Apply the CHG Soap to your body ONLY FROM THE NECK DOWN.   Do not use on face/ open                           Wound or open sores. Avoid contact with eyes, ears mouth and genitals (private parts).                       Wash face,  Genitals (private parts) with your normal soap.             6.  Wash thoroughly, paying special attention to the area where your surgery  will be performed.  7.  Thoroughly rinse your body with warm water from the neck down.  8.  DO NOT shower/wash with your normal soap after using and rinsing off  the CHG Soap.                9.  Michael Roberts  yourself dry with a clean towel.            10.  Wear clean pajamas.            11.  Place clean sheets on your bed the night of your first shower and do not  sleep with pets. Day of Surgery : Do not apply any lotions/deodorants the morning of surgery.  Please wear clean clothes to the hospital/surgery center.  FAILURE TO FOLLOW THESE INSTRUCTIONS MAY RESULT IN THE CANCELLATION OF  YOUR SURGERY PATIENT SIGNATURE_________________________________  NURSE SIGNATURE__________________________________  ________________________________________________________________________

## 2018-09-08 NOTE — Progress Notes (Signed)
EKG 03-12-18 EPIC

## 2018-09-09 ENCOUNTER — Other Ambulatory Visit (HOSPITAL_COMMUNITY)
Admission: RE | Admit: 2018-09-09 | Discharge: 2018-09-09 | Disposition: A | Discharge status: Home / Self Care | Payer: Medicare Other | Source: Ambulatory Visit | Attending: Urology | Admitting: Urology

## 2018-09-09 ENCOUNTER — Encounter (HOSPITAL_COMMUNITY): Payer: Self-pay

## 2018-09-09 ENCOUNTER — Encounter (HOSPITAL_COMMUNITY)
Admission: RE | Admit: 2018-09-09 | Discharge: 2018-09-09 | Disposition: A | Discharge status: Home / Self Care | Payer: Medicare Other | Source: Ambulatory Visit | Attending: Urology | Admitting: Urology

## 2018-09-09 ENCOUNTER — Other Ambulatory Visit: Payer: Self-pay

## 2018-09-09 DIAGNOSIS — Z01812 Encounter for preprocedural laboratory examination: Secondary | ICD-10-CM | POA: Diagnosis not present

## 2018-09-09 DIAGNOSIS — Z1159 Encounter for screening for other viral diseases: Secondary | ICD-10-CM | POA: Diagnosis not present

## 2018-09-09 DIAGNOSIS — N400 Enlarged prostate without lower urinary tract symptoms: Secondary | ICD-10-CM | POA: Diagnosis not present

## 2018-09-09 LAB — GLUCOSE, CAPILLARY: Glucose-Capillary: 166 mg/dL — ABNORMAL HIGH (ref 70–99)

## 2018-09-09 LAB — HEMOGLOBIN A1C
Hgb A1c MFr Bld: 7.8 % — ABNORMAL HIGH (ref 4.8–5.6)
Mean Plasma Glucose: 177.16 mg/dL

## 2018-09-09 LAB — CBC
HCT: 46.2 % (ref 39.0–52.0)
Hemoglobin: 14.5 g/dL (ref 13.0–17.0)
MCH: 29.7 pg (ref 26.0–34.0)
MCHC: 31.4 g/dL (ref 30.0–36.0)
MCV: 94.7 fL (ref 80.0–100.0)
Platelets: 198 10*3/uL (ref 150–400)
RBC: 4.88 MIL/uL (ref 4.22–5.81)
RDW: 12.2 % (ref 11.5–15.5)
WBC: 4 10*3/uL (ref 4.0–10.5)
nRBC: 0 % (ref 0.0–0.2)

## 2018-09-09 LAB — BASIC METABOLIC PANEL
Anion gap: 8 (ref 5–15)
BUN: 22 mg/dL (ref 8–23)
CO2: 26 mmol/L (ref 22–32)
Calcium: 9.5 mg/dL (ref 8.9–10.3)
Chloride: 105 mmol/L (ref 98–111)
Creatinine, Ser: 0.9 mg/dL (ref 0.61–1.24)
GFR calc Af Amer: >60 mL/min (ref >60)
GFR calc non Af Amer: >60 mL/min (ref >60)
Glucose, Bld: 141 mg/dL — ABNORMAL HIGH (ref 70–99)
Potassium: 4.6 mmol/L (ref 3.5–5.1)
Sodium: 139 mmol/L (ref 135–145)

## 2018-09-09 NOTE — Progress Notes (Signed)
Konrad Felix PA  Pt was instructed by Dr. Lynne Logan office to stop Eliqis 09/10/18. His PCP is Dr. Theda Sers at the [facility].A.

## 2018-09-10 LAB — SARS CORONAVIRUS 2 (TAT 6-24 HRS): SARS Coronavirus 2: NEGATIVE

## 2018-09-10 NOTE — Anesthesia Preprocedure Evaluation (Addendum)
Anesthesia Evaluation  Patient identified by MRN, date of birth, ID band Patient awake    Reviewed: Allergy & Precautions, NPO status , Patient's Chart, lab work & pertinent test results  Airway Mallampati: II  TM Distance: >3 FB Neck ROM: Full    Dental no notable dental hx.    Pulmonary former smoker,    Pulmonary exam normal breath sounds clear to auscultation       Cardiovascular hypertension, Pt. on home beta blockers + CAD, + Past MI and +CHF  Normal cardiovascular exam+ dysrhythmias Atrial Fibrillation  Rhythm:Regular Rate:Normal  ECG: rate 63. Sinus  Rhythm  -First degree A-V block  [facility] Chapman Fitch with the [facility]  CATH 2017 1. No angiographic evidence of CAD 2. Severe LV systolic dysfunction  ECHO 2017 Study Conclusions Left ventricle: The cavity size was normal. There was mild concentric hypertrophy. Systolic function was moderately reduced. The estimated ejection fraction was in the range of 25% to 30%.   Neuro/Psych PSYCHIATRIC DISORDERS CVA, No Residual Symptoms    GI/Hepatic negative GI ROS, (+)     substance abuse  ,   Endo/Other  diabetes  Renal/GU negative Renal ROS     Musculoskeletal negative musculoskeletal ROS (+)   Abdominal (+) + obese,   Peds  Hematology HLD   Anesthesia Other Findings PROSTATE CANCER BENIGN PROSTATIC HYPERPLASIA  Reproductive/Obstetrics                           Anesthesia Physical Anesthesia Plan  ASA: III  Anesthesia Plan: General   Post-op Pain Management:    Induction: Intravenous  PONV Risk Score and Plan: 2 and Ondansetron, Dexamethasone and Treatment may vary due to age or medical condition  Airway Management Planned: LMA  Additional Equipment:   Intra-op Plan:   Post-operative Plan: Extubation in OR  Informed Consent: I have reviewed the patients History and Physical,  chart, labs and discussed the procedure including the risks, benefits and alternatives for the proposed anesthesia with the patient or authorized representative who has indicated his/her understanding and acceptance.     Dental advisory given  Plan Discussed with: CRNA  Anesthesia Plan Comments: (Reviewed PAT note 09/09/2018, Konrad Felix, PA-C)       Anesthesia Quick Evaluation

## 2018-09-10 NOTE — H&P (Signed)
Office Visit Report     08/17/2018   --------------------------------------------------------------------------------   Michael Roberts  MRN: [id number]  PRIMARY CARE:  Michael Roberts. Michael Pulse, MD  DOB: Nov 25, 1945, 73 year old Male  REFERRING:  Michael A. Lovena Neighbours, MD  SSN: -[id number]  PROVIDER:  Alexis Roberts, M.D.    TREATING:  Michael Roberts    LOCATION:  [facility] [phone]   --------------------------------------------------------------------------------   CC/HPI: Pt presents today for pre-operative history and physical exam in anticipation of cystoscopy, TURP, space oar, and fiducial marker placement by Dr. Alinda Roberts on 09/13/18. He received cardiac clearance from the [place] in March and is ok to stop Eliquis for up to 72 hours pre-op without the need for bridging. He is doing well and denies F/C, HA, CP, SOB, N/V, diarrhea/constipation, back pain, flank pain, and dysuria.    HX:   CC: Prostate Cancer   PCP: Dr. Dell Roberts  Location of consult: Michael Roberts   Michael Roberts is a 73 year old gentleman who was noted to have an elevated PSA of 7.49 prompting a TRUS biopsy of the prostate on 07/07/17 by Dr. Tresa Roberts. This demonstrated Gleason 3+4=7 (prognostic grade group 2) adenocarcinoma of the prostate with 6 out of 12 biopsy cores positive for malignancy. He was thoroughly counseled by Dr. Tresa Roberts and elected to proceed with active surveillance management considering his disease parameters and his significant medical comorbidities. He was started on finasteride due to his very large prostate and LUTS and his PSA decreased to 2.28 in October 2019. His lower urinary tract symptoms have improved on finasteride although remains significant. He does have a very large prostate gland measuring over 150 cc at the time of his biopsy.   He presented to me initially in December 2019 for a 2nd opinions with concerns about  progression of his prostate cancer. He based this concern on feelings of pain and discomfort bilaterally across his lower abdomen that extended up his abdomen and into his neck. He was convinced that this is related to lymph node positive disease. He also has had some aches in his back. He had pain that extended down into his groin and medial thigh as well. He had lost approximately 10 lb over the past 6 months. He admittedly was extremely anxious and concerned about the possibility of metastatic disease. At that visit, he stated that he no longer wished to proceed with active surveillance due to his severe anxiety about his cancer. We agreed to proceed with staging studies due to his concern and symptoms.   Family history: No prostate cancer. He has a sister who was diagnosed with breast cancer around age 71-40.   Imaging studies:  Bone scan (03/30/18): Uptake at right ischium, right posterior 11th rib, and questionable bilateral ribs.  CT abdomen and pelvis (03/30/18): 12 mm bladder calculus, no metastases  MRI (04/13/18): Subtle sclerosis of right ischium, no focal bone lesion, no definite metastatic disease   PMH: He has a history of hypertension, CHF, CAD with history of MI, atrial fibrillation, CVA, diabetes, and hyperlipidemia. He is on Eliquis. He is followed by cardiology at the [facility] in [place] [place].  PSH: No abdominal surgeries.   TNM stage: cT2a Nx Mx (R medial apex/mid)  PSA: 7.49  Gleason score: 3+4=7  Biopsy (07/07/17): 6/12 cores positive  Left: L lateral base (5%, 3+3=6, PNI), L base (5%, 3+4=7)  Right: R apex (30%, 3+3=6), R mid (40%,  3+3=6), R base (10%, 3+4=7), R lateral base (< 5%, 3+3=6)  Prostate volume: 156 cc  PSAD: 0.05   Urinary function: IPSS is 26. He takes finasteride.  Erectile function: SHIM score is 11. He does have severe erectile dysfunction but occasionally has responded to sildenafil.     ALLERGIES: No Allergies    MEDICATIONS: Finasteride 5 mg tablet  1 tablet PO Daily  Metformin Hcl 500 mg tablet  Simvastatin 20 mg tablet  Carvedilol 25 mg tablet  Eliquis 5 mg (74 tabs) tablet, dose pack  Intresto  Losartan Potassium 100 mg tablet  Spironolactone 25 mg tablet     GU PSH: Locm 300-399Mg /Ml Iodine,1Ml - 03/30/2018 Prostate Needle Biopsy - 07/07/2017      PSH Notes: bilateral Roberts Surgery, Leg Incision Removal Of Foreign Body     NON-GU PSH: Roberts Arthroscopy/surgery, Left - 1994 Roberts Arthroscopy/surgery, Right Leg surgery (unspecified), Left - 1998 Surgical Pathology, Gross And Microscopic Examination For Prostate Needle - 07/07/2017    GU PMH: BPH w/LUTS - 05/07/2018 Nocturia - 05/07/2018 Prostate Cancer (Chronic) - 07/20/2017      PMH Notes:  1898-03-24 00:00:00 - Note: Normal Routine History And Physical Adult   CVA 5 years ago  TIA 10 years ago   NON-GU PMH: Hypertension, Hypertension - 2014 Anxiety Arthritis Atrial Fibrillation Congestive heart failure Depression Diabetes Type 2 Hypercholesterolemia Myocardial Infarction Sleep Apnea Stroke/TIA    FAMILY HISTORY: Brain Cancer - Sister Death In The Family Father - Father Death In The Family Mother - Mother Diabetes - Sister Heart Attack - Sister Hypertension - Sister ovarian cancer - Mother Uterine Cancer - Mother, Father   SOCIAL HISTORY: Marital Status: Married Preferred Language: English; Ethnicity: Not Hispanic Or Latino; Race: Black or African American Current Smoking Status: Patient does not smoke anymore. Has not smoked since 04/24/1977. Smoked for 2 years. Smoked less than 1/2 pack per day.   Tobacco Use Assessment Completed: Used Tobacco in last 30 days? Does not use smokeless tobacco. Does drink.  Patient uses recreational drugs. Uses marijuana. Drinks 3 caffeinated drinks per day. Has not had a blood transfusion. Patient's occupation is/was retired.     Notes: 2 shots of liquor 2 times per day 3-5 times per week  Marijuana once per month    REVIEW OF SYSTEMS:    GU Review Male:   Patient reports leakage of urine. Patient denies frequent urination, hard to postpone urination, burning/ pain with urination, get up at night to urinate, stream starts and stops, trouble starting your streams, and have to strain to urinate .  Gastrointestinal (Lower):   Patient denies diarrhea and constipation.  Gastrointestinal (Upper):   Patient denies nausea and vomiting.  Constitutional:   Patient denies fever, night sweats, weight loss, and fatigue.  Skin:   Patient denies skin rash/ lesion and itching.  Eyes:   Patient denies blurred vision and double vision.  Ears/ Nose/ Throat:   Patient reports sinus problems. Patient denies sore throat.  Hematologic/Lymphatic:   Patient denies easy bruising and swollen glands.  Cardiovascular:   Patient denies leg swelling and chest pains.  Respiratory:   Patient denies cough and shortness of breath.  Endocrine:   Patient denies excessive thirst.  Musculoskeletal:   Patient denies back pain and joint pain.  Neurological:   Patient denies headaches and dizziness.  Psychologic:   Patient denies depression and anxiety.   VITAL SIGNS:      08/17/2018 01:04 PM  Weight 238 lb / 107.95 kg  Height 74 in / 187.96 cm  BP 144/77 mmHg  Roberts 71 /min  Temperature 98.2 F / 36.7 C  BMI 30.6 kg/m   MULTI-SYSTEM PHYSICAL EXAMINATION:    Constitutional: Well-nourished. No physical deformities. Normally developed. Good grooming.  Neck: Neck symmetrical, not swollen. Normal tracheal position.  Respiratory: Normal breath sounds. No labored breathing, no use of accessory muscles.   Cardiovascular: Abnormal heart rhythm. Normal temperature, normal extremity pulses, no swelling, no varicosities.   Lymphatic: No enlargement of neck, axillae, groin.  Skin: No paleness, no jaundice, no cyanosis. No lesion, no ulcer, no rash.  Neurologic / Psychiatric: Oriented to time, oriented to place, oriented to person. No depression,  no anxiety, no agitation.  Gastrointestinal: No mass, no tenderness, no rigidity, non obese abdomen.  Eyes: Normal conjunctivae. Normal eyelids.  Ears, Nose, Mouth, and Throat: Left ear no scars, no lesions, no masses. Right ear no scars, no lesions, no masses. Nose no scars, no lesions, no masses. Normal hearing. Normal lips.  Musculoskeletal: Normal gait and station of head and neck.     PAST DATA REVIEWED:  Source Of History:  Patient  Records Review:   Previous Patient Records  Urine Test Review:   Urinalysis   01/11/18  PSA  Total PSA 2.28 ng/mL    08/17/18  Urinalysis  Urine Appearance Clear   Urine Specimen Voided   Urine Color Yellow   Urine Glucose Neg   Urine Bilirubin Neg   Urine Ketones Neg   Urine Specific Gravity 1.020   Urine Blood Trace Intact   Urine pH 6.0   Urine Protein Neg   Urine Urobilinogen 0.2   Urine Nitrites Neg   Urine Leukocyte Esterase Neg   Urine WBC/hpf 0 - 5/hpf   Urine RBC/hpf 3 - 10/hpf   Urine Epithelial Cells NS (Not Seen)   Urine Bacteria NS (Not Seen)   Urine Mucous Not Present   Urine Yeast NS (Not Seen)   Urine Trichomonas Not Present   Urine Cystals NS (Not Seen)   Urine Casts NS (Not Seen)   Urine Sperm Not Present    PROCEDURES:          Urinalysis w/Scope - 81001 Dipstick Dipstick Cont'd Micro  Specimen: Voided Bilirubin: Neg WBC/hpf: 0 - 5/hpf  Color: Yellow Ketones: Neg RBC/hpf: 3 - 10/hpf  Appearance: Clear Blood: Trace Intact Bacteria: NS (Not Seen)  Specific Gravity: 1.020 Protein: Neg Cystals: NS (Not Seen)  pH: 6.0 Urobilinogen: 0.2 Casts: NS (Not Seen)  Glucose: Neg Nitrites: Neg Trichomonas: Not Present    Leukocyte Esterase: Neg Mucous: Not Present      Epithelial Cells: NS (Not Seen)      Yeast: NS (Not Seen)      Sperm: Not Present    ASSESSMENT:      ICD-10 Details  1 GU:   Prostate Cancer - C61   2   BPH w/LUTS - N40.1    PLAN:           Schedule Return Visit/Planned Activity: Keep Scheduled  Appointment - Schedule Surgery          Document Letter(s):  Created for Patient: Clinical Summary         Notes:   There are no changes in the patients history or physical exam since last evaluation by Dr. Alinda Roberts. Pt is scheduled to undergo cysto, TURP, space oar, and fiducial marker placement on 09/13/18.   All pt's questions were answered to the best of my ability.  Next Appointment:      Next Appointment: 09/13/2018 10:45 AM    Appointment Type: Surgery     Location: [facility] [phone]    Provider: Raynelle Bring, M.D.    Reason for Visit: [facility]/OP SPACE OAR, FIDUCIAL MARKER, CYSTO, TURP      * Signed by Michael Rossetti, PA on 08/17/18 at 1:57 PM (EDT)*

## 2018-09-10 NOTE — Progress Notes (Signed)
Anesthesia Chart Review   Case: [id number] Date/Time: 09/13/18 1035   Procedures:      GOLD SEED IMPLANT (N/A ) - NEEDS 150 MIN FOR ALL PROCEDURES     SPACE OAR INSTILLATION (N/A )     CYSTOSCOPY (N/A )     TRANSURETHRAL RESECTION OF THE PROSTATE (TURP) (N/A )   Anesthesia type: General   Pre-op diagnosis: PROSTATE CANCER, BENIGN PROSTATIC HYPERPLASIA   Location: [facility]   Surgeon: Raynelle Bring, MD      DISCUSSION: 73 y.o. former smoker (.27 pack years, quit 03/24/77) with h/o DM II, HTN, MI (2015), CHF, HLD, CVA 2015, A-fib (on Eliquis), prostate cancer, BPH scheduled for above procedure 09/13/2018 with Dr. Raynelle Bring.   Per OV note with Dr. Tresa Moore (urology) on 08/18/2018 cardiac clearance received from Dr. Chapman Fitch with the [facility], ok to hold Eliquis 72 hours prior to procedure without the need for bridging.    Pt can proceed with planned procedure barring acute status change.  VS: There were no vitals taken for this visit.  PROVIDERS: Shawnee Knapp, MD is PCP last seen 03/12/18  Chapman Fitch, MD is Cardiologist with the [place]: Labs reviewed: Acceptable for surgery. (all labs ordered are listed, but only abnormal results are displayed)  Labs Reviewed  GLUCOSE, CAPILLARY - Abnormal; Notable for the following components:      Result Value   Glucose-Capillary 166 (*)    All other components within normal limits  BASIC METABOLIC PANEL - Abnormal; Notable for the following components:   Glucose, Bld 141 (*)    All other components within normal limits  HEMOGLOBIN A1C - Abnormal; Notable for the following components:   Hgb A1c MFr Bld 7.8 (*)    All other components within normal limits  CBC     IMAGES:   EKG: 03/12/18 Rate 63 bpm Sinus rhythm  1st degree AV block  Inferior infarct, probably not recent Old anterior infarct Left axis secondary to infarct, consider anterior fascicular block   CV: Cardiac Cath 06/22/15 1. No angiographic evidence of  CAD 2. Severe LV systolic dysfunction  Recommendations: No further ischemic workup.   Echo 06/09/15 Study Conclusions  - Left ventricle: The cavity size was normal. There was mild   concentric hypertrophy. Systolic function was moderately reduced.   The estimated ejection fraction was in the range of 25% to 30%.   Diffuse hypokinesis. Akinesis of the inferior and inferoseptal   myocardium. Doppler parameters are consistent with abnormal left   ventricular relaxation (grade 1 diastolic dysfunction). Doppler   parameters are consistent with high ventricular filling pressure. - Aortic valve: Transvalvular velocity was within the normal range.   There was no stenosis. There was mild regurgitation. - Aorta: Ascending aortic AP diameter: 3.9 mm. - Ascending aorta: The ascending aorta was mildly dilated. - Mitral valve: There was mild regurgitation. - Right ventricle: The cavity size was normal. Wall thickness was   normal. Systolic function was normal. - Tricuspid valve: Structurally normal valve. There was no   regurgitation. - Inferior vena cava: The vessel was normal in size. The   respirophasic diameter changes were in the normal range (>= 50%),   consistent with normal central venous pressure. Past Medical History:  Diagnosis Date  . Cataract 2018   developing in right  . Chronic systolic CHF (congestive heart failure) (HCC)    a. EF 30% by echo in 2010  . Diabetes mellitus without complication ([place])   .  History of agent Orange exposure   . HLD (hyperlipidemia)   . HTN (hypertension)   . Myocardial infarction ([place]) 2015  . Prostate cancer ([place]) 2019  . Stroke [facility]) 2015   TIA 2013    Past Surgical History:  Procedure Laterality Date  . CARDIAC CATHETERIZATION N/A 06/22/2015   Procedure: Left Heart Cath and Coronary Angiography;  Surgeon: Burnell Blanks, MD;  Location: [place] CV LAB;  Service: Cardiovascular;  Laterality: N/A;  . PROSTATE BIOPSY       MEDICATIONS: . apixaban (ELIQUIS) 5 MG TABS tablet  . blood glucose meter kit and supplies KIT  . carvedilol (COREG) 25 MG tablet  . carvedilol (COREG) 6.25 MG tablet  . Cholecalciferol (VITAMIN D) 50 MCG (2000 UT) tablet  . finasteride (PROSCAR) 5 MG tablet  . fluticasone (FLONASE) 50 MCG/ACT nasal spray  . furosemide (LASIX) 40 MG tablet  . Leuprolide Acetate, 6 Month, (LUPRON) 45 MG injection  . metFORMIN (GLUCOPHAGE) 500 MG tablet  . Multiple Vitamins-Minerals (MULTIVITAMIN WITH IRON-MINERALS) liquid  . OVER THE COUNTER MEDICATION  . sacubitril-valsartan (ENTRESTO) 49-51 MG  . sacubitril-valsartan (ENTRESTO) 97-103 MG  . simvastatin (ZOCOR) 20 MG tablet  . spironolactone (ALDACTONE) 25 MG tablet   . cyanocobalamin ((VITAMIN B-12)) injection 1,000 mcg    Maia Plan [facility] Pre-Surgical Testing [phone] 09/10/18 1:50 PM

## 2018-09-13 ENCOUNTER — Ambulatory Visit (HOSPITAL_COMMUNITY): Payer: Medicare Other | Admitting: Registered Nurse

## 2018-09-13 ENCOUNTER — Encounter (HOSPITAL_COMMUNITY)
Admission: RE | Disposition: A | Discharge status: Home / Self Care | Payer: Self-pay | Source: Home / Self Care | Attending: Urology

## 2018-09-13 ENCOUNTER — Other Ambulatory Visit: Payer: Self-pay

## 2018-09-13 ENCOUNTER — Encounter (HOSPITAL_COMMUNITY): Payer: Self-pay

## 2018-09-13 ENCOUNTER — Observation Stay (HOSPITAL_COMMUNITY)
Admission: RE | Admit: 2018-09-13 | Discharge: 2018-09-15 | Disposition: A | Discharge status: Home / Self Care | Payer: Medicare Other | Attending: Urology | Admitting: Urology

## 2018-09-13 ENCOUNTER — Ambulatory Visit (HOSPITAL_COMMUNITY): Payer: Medicare Other | Admitting: Physician Assistant

## 2018-09-13 ENCOUNTER — Encounter: Payer: Self-pay | Admitting: Medical Oncology

## 2018-09-13 DIAGNOSIS — N35911 Unspecified urethral stricture, male, meatal: Secondary | ICD-10-CM | POA: Insufficient documentation

## 2018-09-13 DIAGNOSIS — N3941 Urge incontinence: Secondary | ICD-10-CM | POA: Diagnosis not present

## 2018-09-13 DIAGNOSIS — I2510 Atherosclerotic heart disease of native coronary artery without angina pectoris: Secondary | ICD-10-CM | POA: Diagnosis not present

## 2018-09-13 DIAGNOSIS — I4891 Unspecified atrial fibrillation: Secondary | ICD-10-CM | POA: Insufficient documentation

## 2018-09-13 DIAGNOSIS — E119 Type 2 diabetes mellitus without complications: Secondary | ICD-10-CM | POA: Insufficient documentation

## 2018-09-13 DIAGNOSIS — N138 Other obstructive and reflux uropathy: Secondary | ICD-10-CM | POA: Insufficient documentation

## 2018-09-13 DIAGNOSIS — N320 Bladder-neck obstruction: Secondary | ICD-10-CM | POA: Insufficient documentation

## 2018-09-13 DIAGNOSIS — Z87891 Personal history of nicotine dependence: Secondary | ICD-10-CM | POA: Diagnosis not present

## 2018-09-13 DIAGNOSIS — N401 Enlarged prostate with lower urinary tract symptoms: Secondary | ICD-10-CM | POA: Diagnosis not present

## 2018-09-13 DIAGNOSIS — C61 Malignant neoplasm of prostate: Secondary | ICD-10-CM | POA: Diagnosis not present

## 2018-09-13 DIAGNOSIS — Z7984 Long term (current) use of oral hypoglycemic drugs: Secondary | ICD-10-CM | POA: Diagnosis not present

## 2018-09-13 DIAGNOSIS — I252 Old myocardial infarction: Secondary | ICD-10-CM | POA: Diagnosis not present

## 2018-09-13 DIAGNOSIS — I509 Heart failure, unspecified: Secondary | ICD-10-CM | POA: Insufficient documentation

## 2018-09-13 DIAGNOSIS — Z7901 Long term (current) use of anticoagulants: Secondary | ICD-10-CM | POA: Diagnosis not present

## 2018-09-13 DIAGNOSIS — E785 Hyperlipidemia, unspecified: Secondary | ICD-10-CM | POA: Diagnosis not present

## 2018-09-13 DIAGNOSIS — N210 Calculus in bladder: Secondary | ICD-10-CM | POA: Diagnosis not present

## 2018-09-13 DIAGNOSIS — Z8673 Personal history of transient ischemic attack (TIA), and cerebral infarction without residual deficits: Secondary | ICD-10-CM | POA: Insufficient documentation

## 2018-09-13 DIAGNOSIS — Z79899 Other long term (current) drug therapy: Secondary | ICD-10-CM | POA: Insufficient documentation

## 2018-09-13 DIAGNOSIS — I110 Hypertensive heart disease with heart failure: Secondary | ICD-10-CM | POA: Insufficient documentation

## 2018-09-13 HISTORY — PX: TRANSURETHRAL RESECTION OF PROSTATE: SHX73

## 2018-09-13 HISTORY — PX: SPACE OAR INSTILLATION: SHX6769

## 2018-09-13 HISTORY — PX: GOLD SEED IMPLANT: SHX6343

## 2018-09-13 HISTORY — PX: CYSTOSCOPY: SHX5120

## 2018-09-13 LAB — GLUCOSE, CAPILLARY
Glucose-Capillary: 172 mg/dL — ABNORMAL HIGH (ref 70–99)
Glucose-Capillary: 154 mg/dL — ABNORMAL HIGH (ref 70–99)
Glucose-Capillary: 149 mg/dL — ABNORMAL HIGH (ref 70–99)
Glucose-Capillary: 230 mg/dL — ABNORMAL HIGH (ref 70–99)
Glucose-Capillary: 202 mg/dL — ABNORMAL HIGH (ref 70–99)

## 2018-09-13 SURGERY — INSERTION, GOLD SEEDS
Anesthesia: General | Site: Urethra

## 2018-09-13 MED ORDER — LACTATED RINGERS IV SOLN
INTRAVENOUS | Status: DC
  Administered 2018-09-13: 09:00:00 via INTRAVENOUS

## 2018-09-13 MED ORDER — SPIRONOLACTONE 12.5 MG HALF TABLET
12.5000 mg | ORAL_TABLET | Freq: Every day | ORAL | Status: DC
  Administered 2018-09-13 – 2018-09-14 (×2): 12.5 mg via ORAL
  Filled 2018-09-13 (×3): qty 1

## 2018-09-13 MED ORDER — ACETAMINOPHEN 500 MG PO TABS
1000.0000 mg | ORAL_TABLET | Freq: Once | ORAL | Status: AC
  Administered 2018-09-13: 1000 mg via ORAL
  Filled 2018-09-13: qty 2

## 2018-09-13 MED ORDER — SODIUM CHLORIDE (PF) 0.9 % IJ SOLN
INTRAMUSCULAR | Status: AC
  Filled 2018-09-13: qty 10

## 2018-09-13 MED ORDER — DEXAMETHASONE SODIUM PHOSPHATE 10 MG/ML IJ SOLN
INTRAMUSCULAR | Status: AC
  Filled 2018-09-13: qty 1

## 2018-09-13 MED ORDER — EPHEDRINE SULFATE-NACL 50-0.9 MG/10ML-% IV SOSY
PREFILLED_SYRINGE | INTRAVENOUS | Status: DC | PRN
  Administered 2018-09-13 (×6): 10 mg via INTRAVENOUS

## 2018-09-13 MED ORDER — INSULIN ASPART 100 UNIT/ML ~~LOC~~ SOLN
0.0000 [IU] | SUBCUTANEOUS | Status: DC
  Administered 2018-09-13: 5 [IU] via SUBCUTANEOUS
  Administered 2018-09-13: 2 [IU] via SUBCUTANEOUS
  Administered 2018-09-13: 5 [IU] via SUBCUTANEOUS
  Administered 2018-09-13: 3 [IU] via SUBCUTANEOUS
  Administered 2018-09-14 (×2): 2 [IU] via SUBCUTANEOUS
  Administered 2018-09-14: 5 [IU] via SUBCUTANEOUS
  Administered 2018-09-15: 3 [IU] via SUBCUTANEOUS

## 2018-09-13 MED ORDER — FENTANYL CITRATE (PF) 100 MCG/2ML IJ SOLN
INTRAMUSCULAR | Status: AC
  Filled 2018-09-13: qty 2

## 2018-09-13 MED ORDER — PHENYLEPHRINE HCL-NACL 10-0.9 MG/250ML-% IV SOLN
INTRAVENOUS | Status: AC
  Filled 2018-09-13: qty 250

## 2018-09-13 MED ORDER — SODIUM CHLORIDE 0.9% FLUSH
3.0000 mL | Freq: Two times a day (BID) | INTRAVENOUS | Status: DC
  Administered 2018-09-13 – 2018-09-14 (×3): 3 mL via INTRAVENOUS

## 2018-09-13 MED ORDER — ONDANSETRON HCL 4 MG/2ML IJ SOLN
INTRAMUSCULAR | Status: DC | PRN
  Administered 2018-09-13: 4 mg via INTRAVENOUS

## 2018-09-13 MED ORDER — FUROSEMIDE 20 MG PO TABS: 20.0000 mg | ORAL_TABLET | Freq: Every day | ORAL | Status: DC | PRN

## 2018-09-13 MED ORDER — ONDANSETRON HCL 4 MG/2ML IJ SOLN: 4.0000 mg | Freq: Once | INTRAMUSCULAR | Status: DC | PRN

## 2018-09-13 MED ORDER — LIDOCAINE 2% (20 MG/ML) 5 ML SYRINGE
INTRAMUSCULAR | Status: AC
  Filled 2018-09-13: qty 5

## 2018-09-13 MED ORDER — CARVEDILOL 12.5 MG PO TABS
12.5000 mg | ORAL_TABLET | Freq: Two times a day (BID) | ORAL | Status: DC
  Administered 2018-09-13 – 2018-09-14 (×3): 12.5 mg via ORAL
  Filled 2018-09-13 (×4): qty 1

## 2018-09-13 MED ORDER — PHENYLEPHRINE 40 MCG/ML (10ML) SYRINGE FOR IV PUSH (FOR BLOOD PRESSURE SUPPORT)
PREFILLED_SYRINGE | INTRAVENOUS | Status: AC
  Filled 2018-09-13: qty 10

## 2018-09-13 MED ORDER — EPHEDRINE 5 MG/ML INJ
INTRAVENOUS | Status: AC
  Filled 2018-09-13: qty 10

## 2018-09-13 MED ORDER — FENTANYL CITRATE (PF) 100 MCG/2ML IJ SOLN
INTRAMUSCULAR | Status: DC | PRN
  Administered 2018-09-13 (×4): 25 ug via INTRAVENOUS

## 2018-09-13 MED ORDER — SODIUM CHLORIDE 0.9 % IR SOLN
3000.0000 mL | Status: DC
  Administered 2018-09-13 – 2018-09-14 (×10): 3000 mL

## 2018-09-13 MED ORDER — PHENYLEPHRINE 40 MCG/ML (10ML) SYRINGE FOR IV PUSH (FOR BLOOD PRESSURE SUPPORT)
PREFILLED_SYRINGE | INTRAVENOUS | Status: DC | PRN
  Administered 2018-09-13 (×2): 80 ug via INTRAVENOUS

## 2018-09-13 MED ORDER — PROPOFOL 10 MG/ML IV BOLUS
INTRAVENOUS | Status: AC
  Filled 2018-09-13: qty 20

## 2018-09-13 MED ORDER — CIPROFLOXACIN IN D5W 400 MG/200ML IV SOLN
400.0000 mg | Freq: Two times a day (BID) | INTRAVENOUS | Status: AC
  Administered 2018-09-13 – 2018-09-14 (×2): 400 mg via INTRAVENOUS
  Filled 2018-09-13 (×2): qty 200

## 2018-09-13 MED ORDER — LIDOCAINE 2% (20 MG/ML) 5 ML SYRINGE
INTRAMUSCULAR | Status: DC | PRN
  Administered 2018-09-13: 60 mg via INTRAVENOUS

## 2018-09-13 MED ORDER — DEXAMETHASONE SODIUM PHOSPHATE 10 MG/ML IJ SOLN
INTRAMUSCULAR | Status: DC | PRN
  Administered 2018-09-13: 10 mg via INTRAVENOUS

## 2018-09-13 MED ORDER — HYDROCODONE-ACETAMINOPHEN 5-325 MG PO TABS: 1.0000 | ORAL_TABLET | ORAL | Status: DC | PRN

## 2018-09-13 MED ORDER — 0.9 % SODIUM CHLORIDE (POUR BTL) OPTIME
TOPICAL | Status: DC | PRN
  Administered 2018-09-13: 11:00:00 1000 mL

## 2018-09-13 MED ORDER — SIMVASTATIN 10 MG PO TABS
10.0000 mg | ORAL_TABLET | Freq: Every day | ORAL | Status: DC
  Administered 2018-09-13 – 2018-09-14 (×2): 10 mg via ORAL
  Filled 2018-09-13 (×2): qty 1

## 2018-09-13 MED ORDER — ONDANSETRON HCL 4 MG/2ML IJ SOLN
INTRAMUSCULAR | Status: AC
  Filled 2018-09-13: qty 2

## 2018-09-13 MED ORDER — MIDAZOLAM HCL 2 MG/2ML IJ SOLN
INTRAMUSCULAR | Status: AC
  Filled 2018-09-13: qty 2

## 2018-09-13 MED ORDER — SODIUM CHLORIDE FLUSH 0.9 % IV SOLN
INTRAVENOUS | Status: DC | PRN
  Administered 2018-09-13: 10 mL

## 2018-09-13 MED ORDER — SODIUM CHLORIDE 0.9 % IV SOLN: 250.0000 mL | INTRAVENOUS | Status: DC | PRN

## 2018-09-13 MED ORDER — DOCUSATE SODIUM 100 MG PO CAPS
100.0000 mg | ORAL_CAPSULE | Freq: Two times a day (BID) | ORAL | Status: DC
  Administered 2018-09-13 – 2018-09-14 (×3): 100 mg via ORAL
  Filled 2018-09-13 (×4): qty 1

## 2018-09-13 MED ORDER — MIDAZOLAM HCL 5 MG/5ML IJ SOLN
INTRAMUSCULAR | Status: DC | PRN
  Administered 2018-09-13: 2 mg via INTRAVENOUS

## 2018-09-13 MED ORDER — FENTANYL CITRATE (PF) 100 MCG/2ML IJ SOLN: 25.0000 ug | INTRAMUSCULAR | Status: DC | PRN

## 2018-09-13 MED ORDER — SODIUM CHLORIDE 0.45 % IV SOLN
INTRAVENOUS | Status: DC
  Administered 2018-09-13 – 2018-09-14 (×2): via INTRAVENOUS

## 2018-09-13 MED ORDER — PROPOFOL 10 MG/ML IV BOLUS
INTRAVENOUS | Status: DC | PRN
  Administered 2018-09-13: 150 mg via INTRAVENOUS

## 2018-09-13 MED ORDER — SODIUM CHLORIDE 0.9 % IV SOLN
2.0000 g | Freq: Once | INTRAVENOUS | Status: AC
  Administered 2018-09-13: 2 g via INTRAVENOUS
  Filled 2018-09-13: qty 20

## 2018-09-13 MED ORDER — DIPHENHYDRAMINE HCL 12.5 MG/5ML PO ELIX: 12.5000 mg | ORAL_SOLUTION | Freq: Four times a day (QID) | ORAL | Status: DC | PRN

## 2018-09-13 MED ORDER — SODIUM CHLORIDE 0.9 % IR SOLN
Status: DC | PRN
  Administered 2018-09-13: 36000 mL

## 2018-09-13 MED ORDER — FINASTERIDE 5 MG PO TABS
5.0000 mg | ORAL_TABLET | Freq: Every day | ORAL | Status: DC
  Administered 2018-09-14: 5 mg via ORAL
  Filled 2018-09-13 (×2): qty 1

## 2018-09-13 MED ORDER — SODIUM CHLORIDE 0.9% FLUSH: 3.0000 mL | INTRAVENOUS | Status: DC | PRN

## 2018-09-13 MED ORDER — DIPHENHYDRAMINE HCL 50 MG/ML IJ SOLN: 12.5000 mg | Freq: Four times a day (QID) | INTRAMUSCULAR | Status: DC | PRN

## 2018-09-13 SURGICAL SUPPLY — 31 items
BAG URINE DRAINAGE (UROLOGICAL SUPPLIES) ×3 IMPLANT
BAG URO CATCHER STRL LF (MISCELLANEOUS) ×3 IMPLANT
CATH HEMA 3WAY 30CC 22FR COUDE (CATHETERS) ×1 IMPLANT
CATH INTERMIT  6FR 70CM (CATHETERS) ×2 IMPLANT
CLOTH BEACON ORANGE TIMEOUT ST (SAFETY) ×3 IMPLANT
DRSG TEGADERM 8X12 (GAUZE/BANDAGES/DRESSINGS) ×5 IMPLANT
GLOVE BIO SURGEON STRL SZ7.5 (GLOVE) ×4 IMPLANT
GLOVE BIOGEL M STRL SZ7.5 (GLOVE) ×3 IMPLANT
GLOVE ECLIPSE 8.0 STRL XLNG CF (GLOVE) ×3 IMPLANT
GOWN STRL REUS W/TWL LRG LVL3 (GOWN DISPOSABLE) ×6 IMPLANT
GOWN STRL REUS W/TWL XL LVL3 (GOWN DISPOSABLE) ×3 IMPLANT
GUIDEWIRE STR DUAL SENSOR (WIRE) ×2 IMPLANT
HOLDER FOLEY CATH W/STRAP (MISCELLANEOUS) ×1 IMPLANT
IMPL SPACEOAR SYSTEM 10ML (Spacer) ×2 IMPLANT
IMPLANT SPACEOAR SYSTEM 10ML (Spacer) ×3 IMPLANT
KIT TURNOVER KIT A (KITS) ×1 IMPLANT
LOOP CUT BIPOLAR 24F LRG (ELECTROSURGICAL) ×1 IMPLANT
MANIFOLD NEPTUNE II (INSTRUMENTS) ×3 IMPLANT
MARKER GOLD PRELOAD 1.2X3 (Urological Implant) ×2 IMPLANT
NDL SAFETY ECLIPSE 18X1.5 (NEEDLE) IMPLANT
NDL SPNL 22GX7 QUINCKE BK (NEEDLE) IMPLANT
NEEDLE HYPO 18GX1.5 SHARP (NEEDLE)
NEEDLE SPNL 22GX7 QUINCKE BK (NEEDLE) IMPLANT
PACK CYSTO (CUSTOM PROCEDURE TRAY) ×3 IMPLANT
SEED GOLD PRELOAD 1.2X3 (Urological Implant) ×3 IMPLANT
SURGILUBE 2OZ TUBE FLIPTOP (MISCELLANEOUS) ×3 IMPLANT
SYR 20CC LL (SYRINGE) IMPLANT
SYR 30ML LL (SYRINGE) ×1 IMPLANT
SYRINGE IRR TOOMEY STRL 70CC (SYRINGE) ×3 IMPLANT
TUBING CONNECTING 10 (TUBING) ×3 IMPLANT
TUBING UROLOGY SET (TUBING) ×3 IMPLANT

## 2018-09-13 NOTE — Transfer of Care (Signed)
Immediate Anesthesia Transfer of Care Note  Patient: Michael Roberts  Procedure(s) Performed: GOLD SEED IMPLANT (N/A ) SPACE OAR INSTILLATION (N/A ) CYSTOSCOPY (N/A ) TRANSURETHRAL RESECTION OF THE PROSTATE (TURP) (N/A )  Patient Location: PACU  Anesthesia Type:General  Level of Consciousness: sedated  Airway & Oxygen Therapy: Patient Spontanous Breathing and Patient connected to face mask oxygen  Post-op Assessment: Report given to RN and Post -op Vital signs reviewed and stable  Post vital signs: Reviewed and stable  Last Vitals:  Vitals Value Taken Time  BP    Temp    Pulse 64 09/13/18 1234  Resp 13 09/13/18 1235  SpO2 100 % 09/13/18 1234  Vitals shown include unvalidated device data.  Last Pain:  Vitals:   09/13/18 0914  TempSrc:   PainSc: 0-No pain         Complications: No apparent anesthesia complications

## 2018-09-13 NOTE — Op Note (Signed)
Preoperative diagnosis: 1. Bladder outlet obstruction secondary to BPH       2.   Clinically localized adenocarcinoma of the prostate  Postoperative diagnosis:  1. Bladder outlet obstruction secondary to BPH       2.   Clinically localized adenocarcinoma of the prostate       3.   Bladder calculi       4.   Meatal stenosis  Procedure:  1. Cystoscopy 2. SpaceOAR hydrogel insertion 3. Fiducial marker placement 4. Transurethral resectionof the prostate 5. Cystolithalopaxy (2 cm) 6. Urethral dilation  Surgeon: Pryor Curia. M.D.  Anesthesia: General  Complications: None  EBL: Minimal  Specimens: 1. Prostate chips 2. Bladder stones  Disposition of specimens: Pathology and AUS  Indication: Michael Roberts is a patient with bladder outlet obstruction secondary to benign prostatic hyperplasia and prostate cancer. After reviewing the management options for treatment, he elected to proceed with radiation therapy and the above surgical procedure(s) in preparation. We have discussed the potential benefits and risks of the procedure, side effects of the proposed treatment, the likelihood of the patient achieving the goals of the procedure, and any potential problems that might occur during the procedure or recuperation. Informed consent has been obtained.  Description of procedure:  The patient was taken to the operating room and general anesthesia was induced.  The patient was placed in the dorsal lithotomy position, prepped and draped in the usual sterile fashion, and preoperative antibiotics were administered. A preoperative time-out was performed.   Next, transrectal ultrasonography was utilized to visualize the prostate. Three gold fiducial markers were then placed into the prostate via transperineal needles under ultrasound guidance at the left apex, left base, and right mid gland under direct ultrasound guidance. A site in the midline was then selected on the perineum for  placement of an 18 g needle with saline. The needle was advanced above the rectum and below Denonvillier's fascia to the mid gland and confirmed to be in the midline on transverse imaging. One cc of saline was injected confirming appropriate expansion of this space. A total of 5 cc of saline was then injected to open the space further bilaterally. The saline syringe was then removed and the SpaceOAR hydrogel was injected with good distribution bilaterally.   He was then reprepped for his TURP. Cystourethroscopy was performed after his urethral meatus was dilated with Leander Rams sounds serially from 22 Fr to 39 Fr .  The patient's urethra was examined and demonstrated bilobar prostatic hypertrophy.  There were multiple bladder stones measuring up to 2 cm.  The lithotrite was used to fragment these stones which were then removed with Toomey irrigation.  The resectoscope was then placed.  The bladder was then systematically examined in its entirety. There was no evidence of any bladder tumors, stones, or other mucosal pathology.  The ureteral orifices were identified and marked so as to be avoided during the procedure.  The prostate adenoma was then resected utilizing loop cautery resection with the monopolar/bipolar cutting loop.  The prostate adenoma from the bladder neck back to the verumontanum was resected beginning at the six o'clock position and then extended to include the right and left lobes of the prostate and anterior prostate. Care was taken not to resect distal to the verumontanum.  Hemostasis was then achieved with the cautery and the bladder was emptied and reinspected with no significant bleeding noted at the end of the procedure.    A 3 way catheter was  then placed into the bladder and placed on continuous bladder irrigation.  The patient appeared to tolerate the procedure well and without complications.  The patient was able to be awakened and transferred to the recovery unit in  satisfactory condition.

## 2018-09-13 NOTE — Anesthesia Postprocedure Evaluation (Signed)
Anesthesia Post Note  Patient: Michael Roberts  Procedure(s) Performed: GOLD SEED IMPLANT (N/A Urethra) SPACE OAR INSTILLATION (N/A ) CYSTOSCOPY (N/A ) TRANSURETHRAL RESECTION OF THE PROSTATE (TURP) (N/A )     Patient location during evaluation: PACU Anesthesia Type: General Level of consciousness: awake and alert Pain management: pain level controlled Vital Signs Assessment: post-procedure vital signs reviewed and stable Respiratory status: spontaneous breathing, nonlabored ventilation, respiratory function stable and patient connected to nasal cannula oxygen Cardiovascular status: blood pressure returned to baseline and stable Postop Assessment: no apparent nausea or vomiting Anesthetic complications: no    Last Vitals:  Vitals:   09/13/18 1330 09/13/18 1356  BP: 126/82 (!) 141/78  Pulse: (!) 50 (!) 54  Resp: 15 18  Temp: 36.6 C 36.6 C  SpO2: 100% 100%    Last Pain:  Vitals:   09/13/18 1356  TempSrc: Oral  PainSc:                  Kessler Solly P Yakelin Grenier

## 2018-09-13 NOTE — Anesthesia Procedure Notes (Signed)
Procedure Name: LMA Insertion Date/Time: 09/13/2018 10:19 AM Performed by: Talbot Grumbling, CRNA Pre-anesthesia Checklist: Patient identified, Emergency Drugs available, Suction available and Patient being monitored Patient Re-evaluated:Patient Re-evaluated prior to induction Oxygen Delivery Method: Circle system utilized Preoxygenation: Pre-oxygenation with 100% oxygen Induction Type: IV induction Ventilation: Mask ventilation without difficulty LMA: LMA inserted LMA Size: 5.0 Number of attempts: 1 Placement Confirmation: positive ETCO2 and breath sounds checked- equal and bilateral Tube secured with: Tape Dental Injury: Teeth and Oropharynx as per pre-operative assessment

## 2018-09-13 NOTE — Interval H&P Note (Signed)
History and Physical Interval Note:  09/13/2018 9:48 AM  Michael Roberts  has presented today for surgery, with the diagnosis of PROSTATE CANCER, BENIGN PROSTATIC HYPERPLASIA.  The various methods of treatment have been discussed with the patient and family. After consideration of risks, benefits and other options for treatment, the patient has consented to  Procedure(s) with comments: GOLD SEED IMPLANT (N/A) - NEEDS 150 MIN FOR ALL PROCEDURES SPACE OAR INSTILLATION (N/A) CYSTOSCOPY (N/A) TRANSURETHRAL RESECTION OF THE PROSTATE (TURP) (N/A) as a surgical intervention.  The patient's history has been reviewed, patient examined, no change in status, stable for surgery.  I have reviewed the patient's chart and labs.  Questions were answered to the patient's satisfaction.     [employer]

## 2018-09-13 NOTE — Discharge Instructions (Addendum)
1. You may see some blood in the urine and may have some burning with urination for 48-72 hours. You also may notice that you have to urinate more frequently or urgently after your procedure which is normal.  2. You should call should you develop an inability urinate, fever > 101, persistent nausea and vomiting that prevents you from eating or drinking to stay hydrated.  3. If you have a catheter, you will be taught how to take care of the catheter by the nursing staff prior to discharge from the hospital.  You may periodically feel a strong urge to void with the catheter in place.  This is a bladder spasm and most often can occur when having a bowel movement or moving around. It is typically self-limited and usually will stop after a few minutes.  You may use some Vaseline or Neosporin around the tip of the catheter to reduce friction at the tip of the penis. You may also see some blood in the urine.  A very small amount of blood can make the urine look quite red.  As long as the catheter is draining well, there usually is not a problem.  However, if the catheter is not draining well and is bloody, you should call the office [phone]) to notify us.      4.   You may resume Eliquis on Sunday night if your urine is mostly clear.  If it is not, please call the office on Monday.

## 2018-09-13 NOTE — Progress Notes (Signed)
Patient ID: Michael Roberts, male   DOB: 1946/03/16, 73 y.o.   MRN: [id number]  Post-op note  Subjective: The patient is doing well.  No complaints.  Objective: Vital signs in last 24 hours: Temp:  [97.5 F (36.4 C)-98.6 F (37 C)] 98.6 F (37 C) (06/22 2004) Pulse Rate:  [50-70] 66 (06/22 2004) Resp:  [12-19] 18 (06/22 2004) BP: (116-151)/(72-87) 116/76 (06/22 2004) SpO2:  [98 %-100 %] 98 % (06/22 2004) Weight:  [108 kg-110 kg] 110 kg (06/22 1447)  Intake/Output from previous day: No intake/output data recorded. Intake/Output this shift: Total I/O In: 6407.5 [P.O.:240; I.V.:167.5; Other:6000] Out: 3700 [Urine:3700]  Physical Exam:  General: Alert and oriented. Abdomen: Soft, Nondistended. GU: Urine very red on fast drip CBI  Procedure: I irrigated catheter.  No clots noted.  60 cc placed in balloon and placed on significant traction with urine becoming light pink.  Assessment/Plan: POD#0   1) Continue CBI with catheter on traction.  Discussed plan with Joellen Jersey, RN.   Roxy Horseman, Brooke Bonito. MD   LOS: 0 days   Dutch Gray 09/13/2018, 8:30 PM

## 2018-09-14 ENCOUNTER — Encounter (HOSPITAL_COMMUNITY): Payer: Self-pay | Admitting: Urology

## 2018-09-14 DIAGNOSIS — C61 Malignant neoplasm of prostate: Secondary | ICD-10-CM | POA: Diagnosis not present

## 2018-09-14 LAB — BASIC METABOLIC PANEL
Anion gap: 9 (ref 5–15)
BUN: 21 mg/dL (ref 8–23)
CO2: 24 mmol/L (ref 22–32)
Calcium: 8.6 mg/dL — ABNORMAL LOW (ref 8.9–10.3)
Chloride: 102 mmol/L (ref 98–111)
Creatinine, Ser: 0.96 mg/dL (ref 0.61–1.24)
GFR calc Af Amer: >60 mL/min (ref >60)
GFR calc non Af Amer: >60 mL/min (ref >60)
Glucose, Bld: 116 mg/dL — ABNORMAL HIGH (ref 70–99)
Potassium: 4.3 mmol/L (ref 3.5–5.1)
Sodium: 135 mmol/L (ref 135–145)

## 2018-09-14 LAB — GLUCOSE, CAPILLARY
Glucose-Capillary: 108 mg/dL — ABNORMAL HIGH (ref 70–99)
Glucose-Capillary: 111 mg/dL — ABNORMAL HIGH (ref 70–99)
Glucose-Capillary: 211 mg/dL — ABNORMAL HIGH (ref 70–99)
Glucose-Capillary: 133 mg/dL — ABNORMAL HIGH (ref 70–99)
Glucose-Capillary: 134 mg/dL — ABNORMAL HIGH (ref 70–99)

## 2018-09-14 NOTE — Progress Notes (Signed)
Patient ID: KENTO GOSSMAN, male   DOB: 1945-11-29, 73 y.o.   MRN: [id number]  Catheter removed around 10:30 as urine remained clear.  He has voided with some urge incontinence and urine is fairly deep red.    I checked PVR with U/S myself this afternoon and PVR appears minimal.   After discussion, due to fairly red urine, will continue to observe with plans for discharge in the AM if voiding well and urine clearing.  If not, will consider catheter replacement.

## 2018-09-14 NOTE — Progress Notes (Signed)
CBI began running fast and wide open at the beginning of shift. CBI titrated to slow to moderate drip. Pt. Tolerating well, with no c/o pain or pressure from bladder. Urine has progressed to light pink tinged color. No blood clots noted throughout the shift. Pt. Has been educated throughout the care of shift and has been agreeable to plan of care. Will continue to monitor.

## 2018-09-14 NOTE — Progress Notes (Signed)
Patient ID: Michael Roberts, male   DOB: 1945/05/09, 73 y.o.   MRN: [id number]  1 Day Post-Op Subjective: Pt doing well.  Urine has continued to drain well and CBI has been titrated overnight.   Objective: Vital signs in last 24 hours: Temp:  [97.5 F (36.4 C)-98.6 F (37 C)] 98.5 F (36.9 C) (06/23 0416) Pulse Rate:  [50-70] 68 (06/23 0416) Resp:  [12-19] 18 (06/23 0416) BP: (107-151)/(72-87) 107/85 (06/23 0416) SpO2:  [98 %-100 %] 100 % (06/23 0416) Weight:  [108 kg-110 kg] 110 kg (06/22 1447)  Intake/Output from previous day: 06/22 0701 - 06/23 0700 In: 25898.5 [P.O.:700; I.V.:2098.4; IV Piggyback:300.1] Out: 23000 [Urine:22950; Blood:50] Intake/Output this shift: No intake/output data recorded.  Physical Exam:  General: Alert and oriented GU: Urine mostly clear even with CBI turned off and off traction  Lab Results: No results for input(s): HGB, HCT in the last 72 hours. BMET Recent Labs    09/14/18 0540  NA 135  K 4.3  CL 102  CO2 24  GLUCOSE 116*  BUN 21  CREATININE 0.96  CALCIUM 8.6*     Studies/Results: No results found.  Assessment/Plan: 1) BPH/LUTS: Urine much improved from last night and clear even off CBI and off traction with balloon down.  Will monitor over the next couple of hours with CBI off and then make decision about voiding trial.   LOS: 0 days   Dutch Gray 09/14/2018, 7:38 AM

## 2018-09-15 ENCOUNTER — Telehealth: Payer: Self-pay | Admitting: *Deleted

## 2018-09-15 DIAGNOSIS — C61 Malignant neoplasm of prostate: Secondary | ICD-10-CM | POA: Diagnosis not present

## 2018-09-15 LAB — GLUCOSE, CAPILLARY
Glucose-Capillary: 136 mg/dL — ABNORMAL HIGH (ref 70–99)
Glucose-Capillary: 163 mg/dL — ABNORMAL HIGH (ref 70–99)
Glucose-Capillary: 87 mg/dL (ref 70–99)

## 2018-09-15 NOTE — Discharge Summary (Signed)
  Date of admission: 09/13/2018  Date of discharge: 09/15/2018  Admission diagnosis: Prostate cancer, BPH/LUTS  Discharge diagnosis: Same as above  Secondary diagnoses: Hypertension, CAD, atrial fibrillation  History and Physical: For full details, please see admission history and physical. Briefly, Michael Roberts is a 73 y.o. year old patient with prostate cancer and severe LUTS due to BPH.   Hospital Course: He was taken to the OR on 09/13/18 and underwent fiducial marker placement, SpaceOAR insertion, and TURP.  He was maintained on CBI overnight.  His urine was mostly clear the next morning off CBI and his catheter was removed.  His urine was quite red and he was observed.  By 09/15/18, his urine was clearing and he was felt stable for discharge home.  Laboratory values: No results for input(s): HGB, HCT in the last 72 hours. Recent Labs    09/14/18 0540  CREATININE 0.96    Disposition: Home  Discharge instruction: The patient was instructed to be ambulatory but told to refrain from heavy lifting, strenuous activity, or driving.  Discharge medications:  Allergies as of 09/15/2018      Reactions   Pollen Extract Other (See Comments)   sneezing      Medication List    STOP taking these medications   apixaban 5 MG Tabs tablet - Resume on Sunday night. Commonly known as: ELIQUIS   blood glucose meter kit and supplies Kit   finasteride 5 MG tablet Commonly known as: PROSCAR   Leuprolide Acetate (6 Month) 45 MG injection Commonly known as: LUPRON   multivitamin with iron-minerals liquid   OVER THE COUNTER MEDICATION   Vitamin D 50 MCG (2000 UT) tablet     TAKE these medications   carvedilol 25 MG tablet Commonly known as: COREG Take 12.5 mg by mouth 2 (two) times daily with a meal.   carvedilol 6.25 MG tablet Commonly known as: COREG Take 1 tablet (6.25 mg total) by mouth 2 (two) times daily with a meal.   Entresto 97-103 MG Generic drug:  sacubitril-valsartan Take 1 tablet by mouth 2 (two) times daily. What changed: Another medication with the same name was removed. Continue taking this medication, and follow the directions you see here.   fluticasone 50 MCG/ACT nasal spray Commonly known as: FLONASE Place 2 sprays into both nostrils at bedtime.   furosemide 40 MG tablet Commonly known as: LASIX Take 20 mg by mouth daily as needed for fluid.   metFORMIN 500 MG tablet Commonly known as: GLUCOPHAGE Take 500 mg by mouth 2 (two) times daily with a meal.   simvastatin 20 MG tablet Commonly known as: ZOCOR Take 10 mg by mouth at bedtime.   spironolactone 25 MG tablet Commonly known as: ALDACTONE Take 0.5 tablets (12.5 mg total) by mouth daily.       Followup:  Follow-up Information    Raynelle Bring, MD.   Specialty: Urology Why: 11/02/18 at 9:00 AM Contact information: [place] [address] [phone]

## 2018-09-15 NOTE — Telephone Encounter (Signed)
Called patient to inform of sim appt. For 10-22-18 @ 2 pm, lvm for a return call

## 2018-09-15 NOTE — Progress Notes (Signed)
Unable to document accurate output due to pt having incontinent episodes. Pt states urine is getting clear however he is still passing some clots. Pt states he has gone to bathroom 4x during shift. Urinal encouraged for accurate documentation of I/O. PVR checked with bladder scanner and reveals 244 ml. Will continue to monitor closely.

## 2018-09-15 NOTE — Progress Notes (Signed)
Patient ID: Michael Roberts, male   DOB: 25-May-1945, 73 y.o.   MRN: [id number]  2 Days Post-Op Subjective: Urine now light pink and emptying better after passing some clots last night.  Still some urge incontinence but improving.  Objective: Vital signs in last 24 hours: Temp:  [97.9 F (36.6 C)-98.9 F (37.2 C)] 98.7 F (37.1 C) (06/24 0419) Pulse Rate:  [80-92] 80 (06/24 0419) Resp:  [16-18] 18 (06/24 0419) BP: (107-119)/(64-81) 119/64 (06/24 0419) SpO2:  [100 %] 100 % (06/24 0419)  Intake/Output from previous day: 06/23 0701 - 06/24 0700 In: 480 [P.O.:480] Out: 825 [Urine:825] Intake/Output this shift: No intake/output data recorded.  Physical Exam:  General: Alert and oriented Urine: light pink  Lab Results: No results for input(s): HGB, HCT in the last 72 hours. BMET Recent Labs    09/14/18 0540  NA 135  K 4.3  CL 102  CO2 24  GLUCOSE 116*  BUN 21  CREATININE 0.96  CALCIUM 8.6*     Studies/Results: No results found.  Assessment/Plan: Pt now voiding better.  Will d/c home.   LOS: 0 days   Michael Roberts 09/15/2018, 7:13 AM

## 2018-09-17 ENCOUNTER — Ambulatory Visit: Payer: Medicare Other | Admitting: Radiation Oncology

## 2018-10-21 ENCOUNTER — Telehealth: Payer: Self-pay | Admitting: *Deleted

## 2018-10-21 NOTE — Telephone Encounter (Signed)
CALLED PATIENT TO REMIND OF SIM APPT. FOR 10-22-18 - ARRIVAL TIME- 1:45 PM @ [facility] AND HIS MRI FOR 10-22-18 - ARRIVAL TIME- 3:30 PM @ [facility], NO RESTRICTIONS TO TEST, SPOKE WITH PATIENT AND HE IS AWARE OF THESE APPTS.

## 2018-10-22 ENCOUNTER — Ambulatory Visit
Admission: RE | Admit: 2018-10-22 | Discharge: 2018-10-22 | Disposition: A | Discharge status: Home / Self Care | Payer: Medicare Other | Source: Ambulatory Visit | Attending: Radiation Oncology | Admitting: Radiation Oncology

## 2018-10-22 ENCOUNTER — Other Ambulatory Visit: Payer: Self-pay

## 2018-10-22 ENCOUNTER — Ambulatory Visit (HOSPITAL_COMMUNITY)
Admission: RE | Admit: 2018-10-22 | Discharge: 2018-10-22 | Disposition: A | Discharge status: Home / Self Care | Payer: Medicare Other | Source: Ambulatory Visit | Attending: Urology | Admitting: Urology

## 2018-10-22 DIAGNOSIS — C61 Malignant neoplasm of prostate: Secondary | ICD-10-CM | POA: Insufficient documentation

## 2018-10-22 DIAGNOSIS — Z510 Encounter for antineoplastic radiation therapy: Secondary | ICD-10-CM | POA: Insufficient documentation

## 2018-10-22 NOTE — Progress Notes (Signed)
  Radiation Oncology         [phone]) [phone] ________________________________  Name: Michael Roberts MRN: [id number]  Date: 10/22/2018  DOB: 1945-11-21  SIMULATION AND TREATMENT PLANNING NOTE    ICD-10-CM   1. Malignant neoplasm of prostate ([place])  C61     DIAGNOSIS:  73 y.o. gentleman with Stage T2a adenocarcinoma of the prostate with a PSA of 7.49 and a Gleason score of 3+4.    NARRATIVE:  The patient was brought to the [facility].  Identity was confirmed.  All relevant records and images related to the planned course of therapy were reviewed.  The patient freely provided informed written consent to proceed with treatment after reviewing the details related to the planned course of therapy. The consent form was witnessed and verified by the simulation staff.  Then, the patient was set-up in a stable reproducible supine position for radiation therapy.  A vacuum lock pillow device was custom fabricated to position his legs in a reproducible immobilized position.  Then, I performed a urethrogram under sterile conditions to identify the prostatic apex.  CT images were obtained.  Surface markings were placed.  The CT images were loaded into the planning software.  Then the prostate target and avoidance structures including the rectum, bladder, bowel and hips were contoured.  Treatment planning then occurred.  The radiation prescription was entered and confirmed.  A total of one complex treatment devices was fabricated. I have requested : Intensity Modulated Radiotherapy (IMRT) is medically necessary for this case for the following reason:  Rectal sparing.Marland Kitchen  PLAN:  The patient will receive 70 Gy in 28 fractions.  ________________________________  Sheral Apley Tammi Klippel, M.D.  This document serves as a record of services personally performed by Tyler Pita, MD. It was created on his behalf by Wilburn Mylar, a trained medical scribe. The creation of this record is based on the  scribe's personal observations and the provider's statements to them. This document has been checked and approved by the attending provider.

## 2018-10-26 DIAGNOSIS — Z510 Encounter for antineoplastic radiation therapy: Secondary | ICD-10-CM | POA: Diagnosis not present

## 2018-10-26 DIAGNOSIS — C61 Malignant neoplasm of prostate: Secondary | ICD-10-CM | POA: Diagnosis not present

## 2018-11-02 ENCOUNTER — Ambulatory Visit: Payer: Medicare Other

## 2018-11-03 ENCOUNTER — Ambulatory Visit: Payer: Medicare Other

## 2018-11-03 ENCOUNTER — Other Ambulatory Visit: Payer: Self-pay

## 2018-11-03 DIAGNOSIS — Z510 Encounter for antineoplastic radiation therapy: Secondary | ICD-10-CM | POA: Diagnosis not present

## 2018-11-04 ENCOUNTER — Ambulatory Visit
Admission: RE | Admit: 2018-11-04 | Discharge: 2018-11-04 | Disposition: A | Discharge status: Home / Self Care | Payer: Medicare Other | Source: Ambulatory Visit | Attending: Radiation Oncology | Admitting: Radiation Oncology

## 2018-11-04 ENCOUNTER — Other Ambulatory Visit: Payer: Self-pay

## 2018-11-04 ENCOUNTER — Ambulatory Visit: Payer: Medicare Other

## 2018-11-04 DIAGNOSIS — Z510 Encounter for antineoplastic radiation therapy: Secondary | ICD-10-CM | POA: Diagnosis not present

## 2018-11-05 ENCOUNTER — Other Ambulatory Visit: Payer: Self-pay

## 2018-11-05 ENCOUNTER — Ambulatory Visit: Payer: Medicare Other

## 2018-11-05 DIAGNOSIS — Z510 Encounter for antineoplastic radiation therapy: Secondary | ICD-10-CM | POA: Diagnosis not present

## 2018-11-08 ENCOUNTER — Ambulatory Visit: Payer: Medicare Other

## 2018-11-08 ENCOUNTER — Other Ambulatory Visit: Payer: Self-pay

## 2018-11-08 DIAGNOSIS — Z510 Encounter for antineoplastic radiation therapy: Secondary | ICD-10-CM | POA: Diagnosis not present

## 2018-11-09 ENCOUNTER — Ambulatory Visit: Payer: Medicare Other

## 2018-11-09 ENCOUNTER — Other Ambulatory Visit: Payer: Self-pay

## 2018-11-09 DIAGNOSIS — Z510 Encounter for antineoplastic radiation therapy: Secondary | ICD-10-CM | POA: Diagnosis not present

## 2018-11-10 ENCOUNTER — Ambulatory Visit: Payer: Medicare Other

## 2018-11-10 ENCOUNTER — Other Ambulatory Visit: Payer: Self-pay

## 2018-11-10 DIAGNOSIS — Z510 Encounter for antineoplastic radiation therapy: Secondary | ICD-10-CM | POA: Diagnosis not present

## 2018-11-11 ENCOUNTER — Ambulatory Visit: Payer: Medicare Other

## 2018-11-11 ENCOUNTER — Other Ambulatory Visit: Payer: Self-pay

## 2018-11-11 DIAGNOSIS — Z510 Encounter for antineoplastic radiation therapy: Secondary | ICD-10-CM | POA: Diagnosis not present

## 2018-11-12 ENCOUNTER — Ambulatory Visit: Payer: Medicare Other

## 2018-11-12 ENCOUNTER — Other Ambulatory Visit: Payer: Self-pay

## 2018-11-12 DIAGNOSIS — Z510 Encounter for antineoplastic radiation therapy: Secondary | ICD-10-CM | POA: Diagnosis not present

## 2018-11-15 ENCOUNTER — Ambulatory Visit: Payer: Medicare Other

## 2018-11-15 ENCOUNTER — Other Ambulatory Visit: Payer: Self-pay

## 2018-11-15 DIAGNOSIS — Z510 Encounter for antineoplastic radiation therapy: Secondary | ICD-10-CM | POA: Diagnosis not present

## 2018-11-16 ENCOUNTER — Ambulatory Visit: Payer: Medicare Other

## 2018-11-16 ENCOUNTER — Other Ambulatory Visit: Payer: Self-pay

## 2018-11-16 DIAGNOSIS — Z510 Encounter for antineoplastic radiation therapy: Secondary | ICD-10-CM | POA: Diagnosis not present

## 2018-11-17 ENCOUNTER — Ambulatory Visit: Payer: Medicare Other

## 2018-11-17 ENCOUNTER — Other Ambulatory Visit: Payer: Self-pay

## 2018-11-17 DIAGNOSIS — Z510 Encounter for antineoplastic radiation therapy: Secondary | ICD-10-CM | POA: Diagnosis not present

## 2018-11-18 ENCOUNTER — Ambulatory Visit: Payer: Medicare Other

## 2018-11-18 ENCOUNTER — Ambulatory Visit
Admission: RE | Admit: 2018-11-18 | Discharge: 2018-11-18 | Disposition: A | Discharge status: Home / Self Care | Payer: Medicare Other | Source: Ambulatory Visit | Attending: Radiation Oncology | Admitting: Radiation Oncology

## 2018-11-18 ENCOUNTER — Other Ambulatory Visit: Payer: Self-pay

## 2018-11-18 DIAGNOSIS — Z510 Encounter for antineoplastic radiation therapy: Secondary | ICD-10-CM | POA: Diagnosis not present

## 2018-11-19 ENCOUNTER — Other Ambulatory Visit: Payer: Self-pay

## 2018-11-19 ENCOUNTER — Ambulatory Visit: Payer: Medicare Other

## 2018-11-19 DIAGNOSIS — Z510 Encounter for antineoplastic radiation therapy: Secondary | ICD-10-CM | POA: Diagnosis not present

## 2018-11-22 ENCOUNTER — Ambulatory Visit: Payer: Medicare Other

## 2018-11-22 ENCOUNTER — Other Ambulatory Visit: Payer: Self-pay

## 2018-11-22 DIAGNOSIS — Z510 Encounter for antineoplastic radiation therapy: Secondary | ICD-10-CM | POA: Diagnosis not present

## 2018-11-23 ENCOUNTER — Ambulatory Visit: Payer: No Typology Code available for payment source

## 2018-11-23 ENCOUNTER — Other Ambulatory Visit: Payer: Self-pay

## 2018-11-23 DIAGNOSIS — Z510 Encounter for antineoplastic radiation therapy: Secondary | ICD-10-CM | POA: Insufficient documentation

## 2018-11-23 DIAGNOSIS — C61 Malignant neoplasm of prostate: Secondary | ICD-10-CM | POA: Diagnosis not present

## 2018-11-24 ENCOUNTER — Ambulatory Visit: Payer: No Typology Code available for payment source

## 2018-11-24 ENCOUNTER — Other Ambulatory Visit: Payer: Self-pay

## 2018-11-24 DIAGNOSIS — Z510 Encounter for antineoplastic radiation therapy: Secondary | ICD-10-CM | POA: Diagnosis not present

## 2018-11-25 ENCOUNTER — Other Ambulatory Visit: Payer: Self-pay

## 2018-11-25 ENCOUNTER — Ambulatory Visit: Payer: No Typology Code available for payment source

## 2018-11-25 DIAGNOSIS — Z510 Encounter for antineoplastic radiation therapy: Secondary | ICD-10-CM | POA: Diagnosis not present

## 2018-11-26 ENCOUNTER — Ambulatory Visit: Payer: No Typology Code available for payment source

## 2018-11-26 ENCOUNTER — Other Ambulatory Visit: Payer: Self-pay

## 2018-11-26 DIAGNOSIS — Z510 Encounter for antineoplastic radiation therapy: Secondary | ICD-10-CM | POA: Diagnosis not present

## 2018-11-30 ENCOUNTER — Ambulatory Visit: Payer: No Typology Code available for payment source

## 2018-11-30 ENCOUNTER — Other Ambulatory Visit: Payer: Self-pay

## 2018-11-30 DIAGNOSIS — Z510 Encounter for antineoplastic radiation therapy: Secondary | ICD-10-CM | POA: Diagnosis not present

## 2018-12-01 ENCOUNTER — Other Ambulatory Visit: Payer: Self-pay

## 2018-12-01 ENCOUNTER — Ambulatory Visit: Payer: No Typology Code available for payment source

## 2018-12-01 DIAGNOSIS — Z510 Encounter for antineoplastic radiation therapy: Secondary | ICD-10-CM | POA: Diagnosis not present

## 2018-12-02 ENCOUNTER — Ambulatory Visit: Payer: No Typology Code available for payment source

## 2018-12-02 ENCOUNTER — Other Ambulatory Visit: Payer: Self-pay

## 2018-12-02 DIAGNOSIS — Z510 Encounter for antineoplastic radiation therapy: Secondary | ICD-10-CM | POA: Diagnosis not present

## 2018-12-03 ENCOUNTER — Ambulatory Visit: Payer: No Typology Code available for payment source

## 2018-12-03 ENCOUNTER — Other Ambulatory Visit: Payer: Self-pay

## 2018-12-03 DIAGNOSIS — Z510 Encounter for antineoplastic radiation therapy: Secondary | ICD-10-CM | POA: Diagnosis not present

## 2018-12-06 ENCOUNTER — Ambulatory Visit: Payer: No Typology Code available for payment source

## 2018-12-06 ENCOUNTER — Other Ambulatory Visit: Payer: Self-pay

## 2018-12-06 DIAGNOSIS — Z510 Encounter for antineoplastic radiation therapy: Secondary | ICD-10-CM | POA: Diagnosis not present

## 2018-12-07 ENCOUNTER — Ambulatory Visit: Payer: No Typology Code available for payment source

## 2018-12-07 ENCOUNTER — Other Ambulatory Visit: Payer: Self-pay

## 2018-12-07 DIAGNOSIS — Z510 Encounter for antineoplastic radiation therapy: Secondary | ICD-10-CM | POA: Diagnosis not present

## 2018-12-08 ENCOUNTER — Ambulatory Visit: Payer: No Typology Code available for payment source

## 2018-12-08 ENCOUNTER — Other Ambulatory Visit: Payer: Self-pay

## 2018-12-08 DIAGNOSIS — Z510 Encounter for antineoplastic radiation therapy: Secondary | ICD-10-CM | POA: Diagnosis not present

## 2018-12-09 ENCOUNTER — Other Ambulatory Visit: Payer: Self-pay

## 2018-12-09 ENCOUNTER — Ambulatory Visit: Payer: No Typology Code available for payment source

## 2018-12-09 DIAGNOSIS — Z510 Encounter for antineoplastic radiation therapy: Secondary | ICD-10-CM | POA: Diagnosis not present

## 2018-12-10 ENCOUNTER — Other Ambulatory Visit: Payer: Self-pay

## 2018-12-10 ENCOUNTER — Ambulatory Visit
Admission: RE | Admit: 2018-12-10 | Discharge: 2018-12-10 | Disposition: A | Discharge status: Home / Self Care | Payer: No Typology Code available for payment source | Source: Ambulatory Visit | Attending: Radiation Oncology | Admitting: Radiation Oncology

## 2018-12-10 DIAGNOSIS — Z510 Encounter for antineoplastic radiation therapy: Secondary | ICD-10-CM | POA: Diagnosis not present

## 2018-12-13 ENCOUNTER — Other Ambulatory Visit: Payer: Self-pay

## 2018-12-13 ENCOUNTER — Ambulatory Visit
Admission: RE | Admit: 2018-12-13 | Discharge: 2018-12-13 | Disposition: A | Discharge status: Home / Self Care | Payer: No Typology Code available for payment source | Source: Ambulatory Visit | Attending: Radiation Oncology | Admitting: Radiation Oncology

## 2018-12-13 ENCOUNTER — Encounter: Payer: Self-pay | Admitting: Radiation Oncology

## 2018-12-13 DIAGNOSIS — Z510 Encounter for antineoplastic radiation therapy: Secondary | ICD-10-CM | POA: Diagnosis not present

## 2019-01-11 ENCOUNTER — Telehealth: Payer: Self-pay | Admitting: Radiation Oncology

## 2019-01-11 NOTE — Telephone Encounter (Signed)
Received voicemail message from patient inquiring about appointment on Wednesday. Phoned patient back. No answer. Left voicemail message explaining his appointment with Freeman Caldron, PA-C is on Wednesday, October 21 st at 3 pm over the phone. Stressed that [facility] will call him on that day at 3 pm. Advised patient should NOT present to the cancer center for this appointment.

## 2019-01-12 ENCOUNTER — Other Ambulatory Visit: Payer: Self-pay

## 2019-01-12 ENCOUNTER — Encounter: Payer: Self-pay | Admitting: Urology

## 2019-01-12 ENCOUNTER — Ambulatory Visit
Admission: RE | Admit: 2019-01-12 | Discharge: 2019-01-12 | Disposition: A | Discharge status: Home / Self Care | Payer: Medicare Other | Source: Ambulatory Visit | Attending: Urology | Admitting: Urology

## 2019-01-12 DIAGNOSIS — C61 Malignant neoplasm of prostate: Secondary | ICD-10-CM

## 2019-01-12 NOTE — Progress Notes (Signed)
Radiation Oncology         [phone] ________________________________  Name: JIMIE WISELY MRN: [id number]  Date: 01/12/2019  DOB: Oct 12, 1945  Post Treatment Note  CC: Shawnee Knapp, MD  Raynelle Bring, MD  Diagnosis:   73 y.o.gentleman with Stage T2aadenocarcinoma of the prostate with a PSA of 7.49and a Gleason score of 3+4.   Interval Since Last Radiation:  4 weeks  11/03/18-12/13/18: The prostate was treated to a total dose of 70 Gy in 28 fractions of 2.5 Gy each; concurrent with short-term ADT.  Narrative: I spoke with the patient to conduct his routine scheduled 1 month follow up visit via telephone to spare the patient unnecessary potential exposure in the healthcare setting during the current COVID-19 pandemic.  The patient was notified in advance and gave permission to proceed with this visit format. He tolerated treatment well with only mild urinary irritation consisting of nocturia x2/night, mild, occasional dysuria, increased frequency and urgency.  He denied gross hematuria, incontinence, straining to empty his bladder or incontinence. He also denied abdominal pain or bowel issues.  He continued to tolerate the ADT well throughout his course of radiation.                             On review of systems, the patient states that he is doing very well overall.  He continues with mild increased frequency and urgency but denies dysuria, gross hematuria, straining to void, incomplete bladder emptying or incontinence.  He continues with fatigue and decreased libido associated with his ADT which was started back in late April 2020.  He does feel that his energy is gradually improving and he has been able to resume a more normal exercise routine at this point.  Overall, he is quite pleased with his progress to date.  ALLERGIES:  is allergic to pollen extract.  Meds: Current Outpatient Medications  Medication Sig Dispense Refill  . carvedilol (COREG) 25 MG tablet Take 12.5 mg by  mouth 2 (two) times daily with a meal.    . furosemide (LASIX) 40 MG tablet Take 20 mg by mouth daily as needed for fluid.     . metFORMIN (GLUCOPHAGE) 500 MG tablet Take 500 mg by mouth 2 (two) times daily with a meal.     . sacubitril-valsartan (ENTRESTO) 97-103 MG Take 1 tablet by mouth 2 (two) times daily.    . simvastatin (ZOCOR) 20 MG tablet Take 10 mg by mouth at bedtime.     Marland Kitchen spironolactone (ALDACTONE) 25 MG tablet Take 0.5 tablets (12.5 mg total) by mouth daily. 30 tablet 0  . carvedilol (COREG) 6.25 MG tablet Take 1 tablet (6.25 mg total) by mouth 2 (two) times daily with a meal. (Patient not taking: Reported on 09/06/2018) 60 tablet 0  . fluticasone (FLONASE) 50 MCG/ACT nasal spray Place 2 sprays into both nostrils at bedtime. (Patient not taking: Reported on 09/06/2018) 16 g 2   No current facility-administered medications for this encounter.     Physical Findings:  vitals were not taken for this visit.   /.  Unable to assess due to telephone follow-up visit format Lab Findings: Lab Results  Component Value Date   WBC 4.0 09/09/2018   HGB 14.5 09/09/2018   HCT 46.2 09/09/2018   MCV 94.7 09/09/2018   PLT 198 09/09/2018     Radiographic Findings: No results found.  Impression/Plan: 1. 73 y.o.gentleman with Stage T2aadenocarcinoma of the prostate  with a PSA of 7.49and a Gleason score of 3+4.  He will continue to follow up with urology for ongoing PSA determinations and has an appointment scheduled with Dr. Alinda Money in January 2021. He understands what to expect with regards to PSA monitoring going forward.  He is also reassured that his energy, libido and quality of erections will likely improve over the next 1 to 2 months as the ADT medication is cleared from his system and his testosterone begins to return to a more normal level.  I will look forward to following his response to treatment via correspondence with urology, and would be happy to continue to participate in his  care if clinically indicated. I talked to the patient about what to expect in the future, including his risk for erectile dysfunction and rectal bleeding. I encouraged him to call or return to the office if he has any questions regarding his previous radiation or possible radiation side effects. He was comfortable with this plan and will follow up as needed.    Nicholos Johns, PA-C

## 2019-01-14 NOTE — Progress Notes (Signed)
  Radiation Oncology         [phone] ________________________________  Name: Michael Roberts MRN: [id number]  Date: 12/13/2018  DOB: [birth year]  End of Treatment Note  Diagnosis:   73 y.o.gentleman with Stage T2aadenocarcinoma of the prostate with a PSA of 7.49and a Gleason score of 3+4.     Indication for treatment:  Curative, Definitive Radiotherapy       Radiation treatment dates:   11/03/2018 - 12/13/2018  Site/dose:   The prostate was treated to 70 Gy in 28 fractions of 2.5 Gy; concurrent with ADT  Beams/energy:   The patient was treated with IMRT using volumetric arc therapy delivering 6 MV X-rays to clockwise and counterclockwise circumferential arcs with a 90 degree collimator offset to avoid dose scalloping.  Image guidance was performed with daily cone beam CT prior to each fraction to align to gold markers in the prostate and assure proper bladder and rectal fill volumes.  Immobilization was achieved with BodyFix custom mold.  Narrative: The patient tolerated radiation treatment relatively well.  He experienced some minimal hematuria, urinary urgency with leakage, fluctuating fatigue, and diarrhea. He denied any weakening of his stream, difficulty emptying his bladder, and dysuria throughout treatment.  Plan: The patient has completed radiation treatment. He will be contacted in one month for follow up via telephone. I advised him to call or return sooner if he has any questions or concerns related to his recovery or treatment. ________________________________  Sheral Apley. Tammi Klippel, M.D.  This document serves as a record of services personally performed by Tyler Pita, MD. It was created on his behalf by Wilburn Mylar, a trained medical scribe. The creation of this record is based on the scribe's personal observations and the provider's statements to them. This document has been checked and approved by the attending provider.

## 2019-05-20 ENCOUNTER — Ambulatory Visit: Payer: No Typology Code available for payment source

## 2019-06-21 ENCOUNTER — Other Ambulatory Visit: Payer: Self-pay

## 2019-06-21 ENCOUNTER — Other Ambulatory Visit: Payer: Self-pay | Admitting: Adult Health Nurse Practitioner

## 2019-06-21 ENCOUNTER — Ambulatory Visit (INDEPENDENT_AMBULATORY_CARE_PROVIDER_SITE_OTHER): Payer: Medicare Other | Admitting: Adult Health Nurse Practitioner

## 2019-06-21 VITALS — BP 145/76 | HR 58 | Temp 97.8°F | Ht 74.0 in | Wt 243.2 lb

## 2019-06-21 DIAGNOSIS — Z23 Encounter for immunization: Secondary | ICD-10-CM

## 2019-06-21 DIAGNOSIS — E1142 Type 2 diabetes mellitus with diabetic polyneuropathy: Secondary | ICD-10-CM | POA: Diagnosis not present

## 2019-06-21 DIAGNOSIS — Z1211 Encounter for screening for malignant neoplasm of colon: Secondary | ICD-10-CM

## 2019-06-21 DIAGNOSIS — Z135 Encounter for screening for eye and ear disorders: Secondary | ICD-10-CM | POA: Diagnosis not present

## 2019-06-21 DIAGNOSIS — K120 Recurrent oral aphthae: Secondary | ICD-10-CM

## 2019-06-21 MED ORDER — CHLORHEXIDINE GLUCONATE 0.12 % MT SOLN: 15.0000 mL | Freq: Two times a day (BID) | OROMUCOSAL | 0 refills | Status: AC

## 2019-06-21 MED ORDER — AMOXICILLIN 875 MG PO TABS: 875.0000 mg | ORAL_TABLET | Freq: Two times a day (BID) | ORAL | 0 refills | Status: AC

## 2019-06-21 NOTE — Patient Instructions (Signed)
° ° ° °  If you have lab work done today you will be contacted with your lab results within the next 2 weeks.  If you have not heard from us then please contact us. The fastest way to get your results is to register for My Chart. ° ° °IF you received an x-ray today, you will receive an invoice from [facility]. Please contact [facility] at [phone] with questions or concerns regarding your invoice.  ° °IF you received labwork today, you will receive an invoice from [facility]. Please contact [facility] at [phone] with questions or concerns regarding your invoice.  ° °Our billing staff will not be able to assist you with questions regarding bills from these companies. ° °You will be contacted with the lab results as soon as they are available. The fastest way to get your results is to activate your My Chart account. Instructions are located on the last page of this paperwork. If you have not heard from us regarding the results in 2 weeks, please contact this office. °  ° ° ° °

## 2019-06-22 LAB — SPECIMEN STATUS REPORT

## 2019-06-22 LAB — MICROALBUMIN, URINE: Microalbumin, Urine: 279.3 ug/mL

## 2019-07-19 ENCOUNTER — Encounter: Payer: Self-pay | Admitting: Adult Health Nurse Practitioner

## 2019-07-19 DIAGNOSIS — K120 Recurrent oral aphthae: Secondary | ICD-10-CM | POA: Insufficient documentation

## 2019-07-19 NOTE — Progress Notes (Signed)
Acute Office Visit  Subjective:    Patient ID: Michael Roberts, male    DOB: [birth year], 74 y.o.   MRN: [id number]  Chief Complaint  Patient presents with  . Establish Care    Pt stated that he has a blister on the upper Rt side of his gum.    HPI Patient is in today for blister to the upper right side of his gum.  Previously examined by a dentist who told patient that I twould have to be surgically removed.  This has been over 1 year ago and patient is experiencing difficulty chewing and swallowing with exacerbation of blister.  Will wax and wane when irritated.  Concerned about infection.   Past Medical History:  Diagnosis Date  . Cataract 2018   developing in right  . Chronic systolic CHF (congestive heart failure) (HCC)    a. EF 30% by echo in 2010  . Diabetes mellitus without complication ([place])   . History of agent Orange exposure   . HLD (hyperlipidemia)   . HTN (hypertension)   . Myocardial infarction ([place]) 2015  . Prostate cancer ([place]) 2019  . Stroke [facility]) 2015   TIA 2013    Past Surgical History:  Procedure Laterality Date  . CARDIAC CATHETERIZATION N/A 06/22/2015   Procedure: Left Heart Cath and Coronary Angiography;  Surgeon: Burnell Blanks, MD;  Location: [facility];  Service: Cardiovascular;  Laterality: N/A;  . CYSTOSCOPY N/A 09/13/2018   Procedure: CYSTOSCOPY;  Surgeon: Raynelle Bring, MD;  Location: [facility];  Service: Urology;  Laterality: N/A;  . GOLD SEED IMPLANT N/A 09/13/2018   Procedure: GOLD SEED IMPLANT;  Surgeon: Raynelle Bring, MD;  Location: [facility];  Service: Urology;  Laterality: N/A;  NEEDS 150 MIN FOR ALL PROCEDURES  . PROSTATE BIOPSY    . SPACE OAR INSTILLATION N/A 09/13/2018   Procedure: SPACE OAR INSTILLATION;  Surgeon: Raynelle Bring, MD;  Location: [facility];  Service: Urology;  Laterality: N/A;  . TRANSURETHRAL RESECTION OF PROSTATE N/A 09/13/2018   Procedure: TRANSURETHRAL RESECTION OF THE PROSTATE (TURP);  Surgeon: Raynelle Bring, MD;  Location: [facility];  Service: Urology;  Laterality: N/A;    Family History  Problem Relation Age of Onset  . Diabetes Mother   . Cancer Mother   . Uterine cancer Mother   . Heart attack Sister        Age 73  . Cancer Sister 40       brain tumor  . Prostate cancer Neg Hx     Social History   Socioeconomic History  . Marital status: Married    Spouse name: Solly Damme  . Number of children: Not on file  . Years of education: Not on file  . Highest education level: Not on file  Occupational History  . Not on file  Tobacco Use  . Smoking status: Former Smoker    Packs/day: 0.33    Years: 2.00    Pack years: 0.66    Types: Cigarettes    Quit date: 03/24/1977    Years since quitting: 42.3  . Smokeless tobacco: Never Used  Substance and Sexual Activity  . Alcohol use: Yes    Alcohol/week: 4.0 standard drinks    Types: 1 Glasses of wine, 3 Shots of liquor per week  . Drug use: Yes    Frequency: 2.0 times per week    Types: Marijuana    Comment: occational  . Sexual activity: Yes  Other Topics Concern  .  Not on file  Social History Narrative   Wife is Quillan Bork   Social Determinants of Health   Financial Resource Strain:   . Difficulty of Paying Living Expenses:   Food Insecurity:   . Worried About Charity fundraiser in the Last Year:   . Arboriculturist in the Last Year:   Transportation Needs:   . Film/video editor (Medical):   Marland Kitchen Lack of Transportation (Non-Medical):   Physical Activity:   . Days of Exercise per Week:   . Minutes of Exercise per Session:   Stress:   . Feeling of Stress :   Social Connections:   . Frequency of Communication with Friends and Family:   . Frequency of Social Gatherings with Friends and Family:   . Attends Religious Services:   . Active Member of Clubs or Organizations:   . Attends Archivist Meetings:   Marland Kitchen Marital Status:   Intimate Partner Violence:   . Fear of Current or Ex-Partner:   .  Emotionally Abused:   Marland Kitchen Physically Abused:   . Sexually Abused:     Outpatient Medications Prior to Visit  Medication Sig Dispense Refill  . carvedilol (COREG) 25 MG tablet Take 12.5 mg by mouth 2 (two) times daily with a meal.    . carvedilol (COREG) 6.25 MG tablet Take 1 tablet (6.25 mg total) by mouth 2 (two) times daily with a meal. 60 tablet 0  . fluticasone (FLONASE) 50 MCG/ACT nasal spray Place 2 sprays into both nostrils at bedtime. 16 g 2  . furosemide (LASIX) 40 MG tablet Take 20 mg by mouth daily as needed for fluid.     . metFORMIN (GLUCOPHAGE) 500 MG tablet Take 500 mg by mouth 2 (two) times daily with a meal.     . sacubitril-valsartan (ENTRESTO) 97-103 MG Take 1 tablet by mouth 2 (two) times daily.    . simvastatin (ZOCOR) 20 MG tablet Take 10 mg by mouth at bedtime.     Marland Kitchen spironolactone (ALDACTONE) 25 MG tablet Take 0.5 tablets (12.5 mg total) by mouth daily. 30 tablet 0   No facility-administered medications prior to visit.    Allergies  Allergen Reactions  . Pollen Extract Other (See Comments)    sneezing    Review of Systems    Review of Systems See HPI Constitution: No fevers or chills No malaise No diaphoresis Skin: No rash or itching Eyes: no blurry vision, no double vision GU: no dysuria or hematuria Neuro: no dizziness or headaches  Objective:    Physical Exam  BP (!) 145/76 (BP Location: Left Arm, Patient Position: Sitting, Cuff Size: Large)   Pulse (!) 58   Temp 97.8 F (36.6 C) (Temporal)   Ht 6\' 2"  (1.88 m)   Wt 243 lb 3.2 oz (110.3 kg)   SpO2 98%   BMI 31.23 kg/m  Wt Readings from Last 3 Encounters:  06/21/19 243 lb 3.2 oz (110.3 kg)  09/13/18 242 lb 8.1 oz (110 kg)  05/07/18 245 lb (111.1 kg)   General appearance: alert, well appearing, and in no distress, oriented to person, place, and time and anxious. Head; normocephalic, atraumatic. PERLA. ENT- tonsillar hypertrophy bilaterally with ulcer approximately 1 x 2 cm to upper gum  line, adjacent to 1st molar. .  Chest: clear to auscultation, no wheezes, rales or rhonchi, symmetric air entry.  CVS exam: normal rate, regular rhythm, normal S1, S2, no murmurs, rubs, clicks or gallops. Skin exam - normal  coloration and turgor, no rashes, no suspicious skin lesions noted. Mental Status: normal mood, behavior, speech, dress, motor activity, and thought processes, anxious.    Health Maintenance Due  Topic Date Due  . COVID-19 Vaccine (1) Never done  . PNA vac Low Risk Adult (1 of 2 - PCV13) Never done  . COLONOSCOPY  05/29/2012  . HEMOGLOBIN A1C  03/11/2019    There are no preventive care reminders to display for this patient.   Lab Results  Component Value Date   TSH 1.730 03/12/2018   Lab Results  Component Value Date   WBC 4.0 09/09/2018   HGB 14.5 09/09/2018   HCT 46.2 09/09/2018   MCV 94.7 09/09/2018   PLT 198 09/09/2018   Lab Results  Component Value Date   NA 135 09/14/2018   K 4.3 09/14/2018   CO2 24 09/14/2018   GLUCOSE 116 (H) 09/14/2018   BUN 21 09/14/2018   CREATININE 0.96 09/14/2018   BILITOT 0.4 03/12/2018   ALKPHOS 84 03/12/2018   AST 21 03/12/2018   ALT 22 03/12/2018   PROT 7.4 03/12/2018   ALBUMIN 4.7 03/12/2018   CALCIUM 8.6 (L) 09/14/2018   ANIONGAP 9 09/14/2018   Lab Results  Component Value Date   CHOL 139 10/30/2016   Lab Results  Component Value Date   HDL 39 (L) 10/30/2016   Lab Results  Component Value Date   LDLCALC 72 10/30/2016   Lab Results  Component Value Date   TRIG 141 10/30/2016   Lab Results  Component Value Date   CHOLHDL 3.6 10/30/2016   Lab Results  Component Value Date   HGBA1C 7.8 (H) 09/09/2018       Assessment & Plan:   Problem List Items Addressed This Visit      Active Problems   Type 2 diabetes mellitus ([place]) - Primary   Relevant Orders   HM Diabetes Foot Exam (Completed)   Screening for colon cancer   Need for prophylactic vaccination against Streptococcus pneumoniae  (pneumococcus)   Screening for diabetic retinopathy   Aphthous ulcer of mouth     Will go ahead and treat patient for apthous ulcer and explained how to use Peridex.  I have advised him to seek another opinion for dentist and/or if no improvement, could refer to Otolaryngology.  He is inline with this plan.  F/u prn.   Meds ordered this encounter  Medications  . chlorhexidine (PERIDEX) 0.12 % solution    Sig: Use as directed 15 mLs in the mouth or throat 2 (two) times daily.    Dispense:  120 mL    Refill:  0  . amoxicillin (AMOXIL) 875 MG tablet    Sig: Take 1 tablet (875 mg total) by mouth 2 (two) times daily.    Dispense:  20 tablet    Refill:  0     Glyn Ade, NP

## 2019-11-05 IMAGING — NM NM BONE WHOLE BODY
2 series · 2 of 2 positions shown · non-contrast
Comparison: None

Radiographic correlation: CT abdomen and pelvis 03/30/2018

CLINICAL DATA: Prostate cancer, PSA

EXAM:
NUCLEAR MEDICINE WHOLE BODY BONE SCAN
TECHNIQUE: Whole body anterior and posterior images were obtained approximately
3 hours after intravenous injection of radiopharmaceutical.
RADIOPHARMACEUTICALS:  21.8 mCi Kechnetium-EEm MDP IV

[Series 1: whole body · 2.66mm/px · 1 of 1 slices shown (1 of 2)]
[im 1/1]
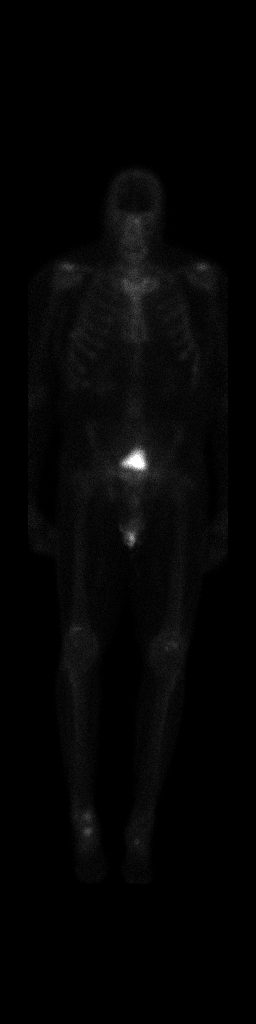

[Series 1: whole body · 2.66mm/px · 1 of 1 slices shown (2 of 2)]
[im 1/1]
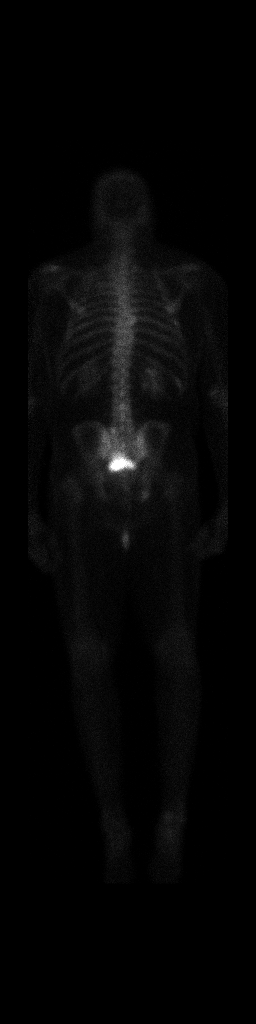

[2 of 2 positions shown; findings below may reference images not displayed]

FINDINGS: Uptake at the shoulders, knees, and feet, typically degenerative.

Focal abnormal increased tracer accumulation is seen at the RIGHT
ischium, posterior RIGHT eleventh rib, and at a lateral mid RIGHT
rib approximately seventh.

Questionable increased tracer localization posterolateral LEFT
eighth rib.

Unable to exclude metastases at the sites.

Subtle focus of sclerosis identified at the posterior RIGHT eleventh
rib image 27 on CT.

Probable subtle focus of sclerosis at the RIGHT ischium on CT image
87.

No additional sites of abnormal osseous tracer accumulation are
seen.

Expected urinary tract and soft tissue distribution of tracer.
IMPRESSION: Sites of increased tracer accumulation at the RIGHT ischium, RIGHT
posterior eleventh rib, and questionably additional BILATERAL ribs
suspicious for osseous metastases as above.
